# Patient Record
Sex: Female | Born: 1935 | Race: White | Hispanic: No | Marital: Married | Smoking: Former smoker
Health system: Southern US, Community
[De-identification: ages and names within clinical notes are randomized; demographics above are authoritative.]

## PROBLEM LIST (undated history)

## (undated) DIAGNOSIS — I10 Essential (primary) hypertension: Secondary | ICD-10-CM

## (undated) DIAGNOSIS — I2510 Atherosclerotic heart disease of native coronary artery without angina pectoris: Secondary | ICD-10-CM

## (undated) HISTORY — PX: APPENDECTOMY: SHX54

## (undated) HISTORY — PX: ABDOMINAL HYSTERECTOMY: SHX81

## (undated) HISTORY — PX: CHOLECYSTECTOMY: SHX55

---

## 2020-02-04 ENCOUNTER — Other Ambulatory Visit: Payer: Self-pay

## 2020-02-04 ENCOUNTER — Encounter (HOSPITAL_BASED_OUTPATIENT_CLINIC_OR_DEPARTMENT_OTHER): Payer: Medicare Other | Attending: Internal Medicine | Admitting: Internal Medicine

## 2020-02-04 DIAGNOSIS — I87312 Chronic venous hypertension (idiopathic) with ulcer of left lower extremity: Secondary | ICD-10-CM | POA: Diagnosis present

## 2020-02-04 DIAGNOSIS — I129 Hypertensive chronic kidney disease with stage 1 through stage 4 chronic kidney disease, or unspecified chronic kidney disease: Secondary | ICD-10-CM | POA: Insufficient documentation

## 2020-02-04 DIAGNOSIS — Z809 Family history of malignant neoplasm, unspecified: Secondary | ICD-10-CM | POA: Diagnosis not present

## 2020-02-04 DIAGNOSIS — Z888 Allergy status to other drugs, medicaments and biological substances status: Secondary | ICD-10-CM | POA: Diagnosis not present

## 2020-02-04 DIAGNOSIS — Z8249 Family history of ischemic heart disease and other diseases of the circulatory system: Secondary | ICD-10-CM | POA: Diagnosis not present

## 2020-02-04 DIAGNOSIS — Z87891 Personal history of nicotine dependence: Secondary | ICD-10-CM | POA: Insufficient documentation

## 2020-02-04 DIAGNOSIS — N189 Chronic kidney disease, unspecified: Secondary | ICD-10-CM | POA: Insufficient documentation

## 2020-02-04 DIAGNOSIS — Z880 Allergy status to penicillin: Secondary | ICD-10-CM | POA: Insufficient documentation

## 2020-02-04 DIAGNOSIS — N3281 Overactive bladder: Secondary | ICD-10-CM | POA: Insufficient documentation

## 2020-02-04 DIAGNOSIS — L97829 Non-pressure chronic ulcer of other part of left lower leg with unspecified severity: Secondary | ICD-10-CM | POA: Insufficient documentation

## 2020-02-04 DIAGNOSIS — I87332 Chronic venous hypertension (idiopathic) with ulcer and inflammation of left lower extremity: Secondary | ICD-10-CM | POA: Insufficient documentation

## 2020-02-04 DIAGNOSIS — M170 Bilateral primary osteoarthritis of knee: Secondary | ICD-10-CM | POA: Diagnosis not present

## 2020-02-09 NOTE — Progress Notes (Signed)
Melissa Noble, Melissa Noble ([id number]) Visit Report for 02/04/2020 Allergy List Details Patient Name: Date of Service: Melissa Noble, Melissa Noble 02/04/2020 10:30 A M Medical Record Number: [id number] Patient Account Number: [id number] Date of Birth/Sex: Treating RN: 12-08-1923 (86 y.o. Female) Carlene Coria Primary Care Barbarajean Kinzler: Other Clinician: Referring Xitlaly Ault: Treating Rosha Cocker/Extender: Cheree Ditto in Treatment: 0 Allergies Active Allergies ticlopidine Depakote penicillin Allergy Notes Electronic Signature(s) Signed: 02/08/2020 5:49:17 PM By: Carlene Coria RN Entered By: Carlene Coria on 02/04/2020 11:12:40 -------------------------------------------------------------------------------- Arrival Information Details Patient Name: Date of Service: Melissa Noble, Melissa Noble 02/04/2020 10:30 A M Medical Record Number: [id number] Patient Account Number: [id number] Date of Birth/Sex: Treating RN: March 05, 1924 (86 y.o. Female) Carlene Coria Primary Care Helayna Dun: Other Clinician: Referring Shemeka Wardle: Treating Aslin Farinas/Extender: Cheree Ditto in Treatment: 0 Visit Information Patient Arrived: Charlyn Minerva Time: 11:07 Accompanied By: son Transfer Assistance: None Patient Identification Verified: Yes Secondary Verification Process Completed: Yes Patient Requires Transmission-Based Precautions: No Patient Has Alerts: Yes Patient Alerts: Patient on Blood Thinner Electronic Signature(s) Signed: 02/08/2020 5:49:17 PM By: Carlene Coria RN Entered By: Carlene Coria on 02/04/2020 11:08:12 -------------------------------------------------------------------------------- Clinic Level of Care Assessment Details Patient Name: Date of Service: Melissa Noble, Melissa Noble 02/04/2020 10:30 A M Medical Record Number: [id number] Patient Account Number: [id number] Date of Birth/Sex: Treating RN: Oct 09, 1923 (86 y.o. Female) Levan Hurst Primary Care Asuzena Weis: Other Clinician: Referring Leyanna Bittman: Treating  Nivia Gervase/Extender: Cheree Ditto in Treatment: 0 Clinic Level of Care Assessment Items TOOL 2 Quantity Score X- 1 0 Use when only an EandM is performed on the INITIAL visit ASSESSMENTS - Nursing Assessment / Reassessment X- 1 20 General Physical Exam (combine w/ comprehensive assessment (listed just below) when performed on new pt. evals) X- 1 25 Comprehensive Assessment (HX, ROS, Risk Assessments, Wounds Hx, etc.) ASSESSMENTS - Wound and Skin A ssessment / Reassessment []  - 0 Simple Wound Assessment / Reassessment - one wound []  - 0 Complex Wound Assessment / Reassessment - multiple wounds []  - 0 Dermatologic / Skin Assessment (not related to wound area) ASSESSMENTS - Ostomy and/or Continence Assessment and Care []  - 0 Incontinence Assessment and Management []  - 0 Ostomy Care Assessment and Management (repouching, etc.) PROCESS - Coordination of Care X - Simple Patient / Family Education for ongoing care 1 15 []  - 0 Complex (extensive) Patient / Family Education for ongoing care X- 1 10 Staff obtains Programmer, systems, Records, T Results / Process Orders est X- 1 10 Staff telephones HHA, Nursing Homes / Clarify orders / etc []  - 0 Routine Transfer to another Facility (non-emergent condition) []  - 0 Routine Hospital Admission (non-emergent condition) X- 1 15 New Admissions / Biomedical engineer / Ordering NPWT Apligraf, etc. , []  - 0 Emergency Hospital Admission (emergent condition) X- 1 10 Simple Discharge Coordination []  - 0 Complex (extensive) Discharge Coordination PROCESS - Special Needs []  - 0 Pediatric / Minor Patient Management []  - 0 Isolation Patient Management []  - 0 Hearing / Language / Visual special needs []  - 0 Assessment of Community assistance (transportation, D/C planning, etc.) []  - 0 Additional assistance / Altered mentation []  - 0 Support Surface(s) Assessment (bed, cushion, seat, etc.) INTERVENTIONS - Wound Cleansing /  Measurement []  - 0 Wound Imaging (photographs - any number of wounds) []  - 0 Wound Tracing (instead of photographs) []  - 0 Simple Wound Measurement - one wound []  - 0 Complex Wound Measurement - multiple wounds []  - 0 Simple Wound Cleansing - one wound []  - 0 Complex Wound Cleansing - multiple wounds INTERVENTIONS - Wound Dressings []  -  0 Small Wound Dressing one or multiple wounds []  - 0 Medium Wound Dressing one or multiple wounds []  - 0 Large Wound Dressing one or multiple wounds []  - 0 Application of Medications - injection INTERVENTIONS - Miscellaneous []  - 0 External ear exam []  - 0 Specimen Collection (cultures, biopsies, blood, body fluids, etc.) []  - 0 Specimen(s) / Culture(s) sent or taken to Lab for analysis []  - 0 Patient Transfer (multiple staff / Civil Service fast streamer / Similar devices) []  - 0 Simple Staple / Suture removal (25 or less) []  - 0 Complex Staple / Suture removal (26 or more) []  - 0 Hypo / Hyperglycemic Management (close monitor of Blood Glucose) []  - 0 Ankle / Brachial Index (ABI) - do not check if billed separately Has the patient been seen at the hospital within the last three years: Yes Total Score: 105 Level Of Care: New/Established - Level 3 Electronic Signature(s) Signed: 02/04/2020 4:21:43 PM By: Levan Hurst RN, BSN Entered By: Levan Hurst on 02/04/2020 11:49:25 -------------------------------------------------------------------------------- Lower Extremity Assessment Details Patient Name: Date of Service: Melissa Noble, Melissa Noble 02/04/2020 10:30 A M Medical Record Number: [id number] Patient Account Number: [id number] Date of Birth/Sex: Treating RN: 1923/09/23 (86 y.o. Female) Carlene Coria Primary Care Vallie Teters: Other Clinician: Referring Kahmari Herard: Treating Jazlene Bares/Extender: Cheree Ditto in Treatment: 0 Edema Assessment Assessed: [Left: No] [Right: No] [Left: Edema] [Right: :] Calf Left: Right: Point of Measurement: cm From  Medial Instep 26 cm cm Ankle Left: Right: Point of Measurement: cm From Medial Instep 20 cm cm Electronic Signature(s) Signed: 02/08/2020 5:49:17 PM By: Carlene Coria RN Entered By: Carlene Coria on 02/04/2020 11:32:02 -------------------------------------------------------------------------------- Pain Assessment Details Patient Name: Date of Service: Melissa Noble, Melissa Noble 02/04/2020 10:30 A M Medical Record Number: [id number] Patient Account Number: [id number] Date of Birth/Sex: Treating RN: 1924/02/14 (86 y.o. Female) Carlene Coria Primary Care Jalaiyah Throgmorton: Other Clinician: Referring Anaysia Germer: Treating Javoris Star/Extender: Cheree Ditto in Treatment: 0 Active Problems Location of Pain Severity and Description of Pain Patient Has Paino No Site Locations Pain Management and Medication Current Pain Management: Electronic Signature(s) Signed: 02/08/2020 5:49:17 PM By: Carlene Coria RN Entered By: Carlene Coria on 02/04/2020 11:32:24 -------------------------------------------------------------------------------- Patient/Caregiver Education Details Patient Name: Date of Service: Melissa Noble 8/23/2021andnbsp10:30 [place] Record Number: [id number] Patient Account Number: [id number] Date of Birth/Gender: Treating RN: June 05, 1924 (86 y.o. Female) Levan Hurst Primary Care Physician: Other Clinician: Referring Physician: Treating Physician/Extender: Cheree Ditto in Treatment: 0 Education Assessment Education Provided To: Patient Education Topics Provided Venous: Methods: Explain/Verbal Responses: State content correctly Wound/Skin Impairment: Methods: Explain/Verbal Responses: State content correctly Electronic Signature(s) Signed: 02/04/2020 4:21:43 PM By: Levan Hurst RN, BSN Entered By: Levan Hurst on 02/04/2020 11:48:50 -------------------------------------------------------------------------------- Vitals Details Patient Name: Date of  Service: Melissa Noble, Melissa Noble 02/04/2020 10:30 A M Medical Record Number: [id number] Patient Account Number: [id number] Date of Birth/Sex: Treating RN: 07/02/1923 (86 y.o. Female) Epps, [place] Primary Care Korben Carcione: Other Clinician: Referring Melba Araki: Treating Armelia Penton/Extender: Cheree Ditto in Treatment: 0 Vital Signs Time Taken: 11:11 Temperature (F): 98.4 Height (in): 61 Pulse (bpm): 77 Source: Stated Respiratory Rate (breaths/min): 18 Weight (lbs): 98 Blood Pressure (mmHg): 160/60 Source: Stated Reference Range: 80 - 120 mg / dl Body Mass Index (BMI): 18.5 Electronic Signature(s) Signed: 02/08/2020 5:49:17 PM By: Carlene Coria RN Entered By: Carlene Coria on 02/04/2020 11:11:43

## 2020-02-09 NOTE — Progress Notes (Signed)
Melissa Noble, Melissa Noble ([id number]) Visit Report for 02/04/2020 Abuse/Suicide Risk Screen Details Patient Name: Date of Service: Melissa Noble, Melissa Noble 02/04/2020 10:30 A M Medical Record Number: [id number] Patient Account Number: [id number] Date of Birth/Sex: Treating RN: Feb 10, 1924 (86 y.o. Female) Carlene Coria Primary Care Kortney Schoenfelder: Other Clinician: Referring Lacie Landry: Treating Rajah Lamba/Extender: Cheree Ditto in Treatment: 0 Abuse/Suicide Risk Screen Items Answer ABUSE RISK SCREEN: Has anyone close to you tried to hurt or harm you recentlyo No Do you feel uncomfortable with anyone in your familyo No Has anyone forced you do things that you didnt want to doo No Electronic Signature(s) Signed: 02/08/2020 5:49:17 PM By: Carlene Coria RN Entered By: Carlene Coria on 02/04/2020 11:19:24 -------------------------------------------------------------------------------- Activities of Daily Living Details Patient Name: Date of Service: Melissa Noble, Melissa Noble 02/04/2020 10:30 [place] Record Number: [id number] Patient Account Number: [id number] Date of Birth/Sex: Treating RN: May 30, 1924 (86 y.o. Female) Carlene Coria Primary Care Lanetra Hartley: Other Clinician: Referring Benetta Maclaren: Treating Theora Vankirk/Extender: Cheree Ditto in Treatment: 0 Activities of Daily Living Items Answer Activities of Daily Living (Please select one for each item) Drive Automobile Not Able T Medications ake Need Assistance Use T elephone Completely Able Care for Appearance Completely Able Use T oilet Completely Able Bath / Shower Completely Able Dress Self Completely Able Feed Self Completely Able Walk Completely Able Get In / Out Bed Completely Able Housework Not Able Prepare Meals Not Able Handle Money Need Assistance Shop for Self Need Assistance Electronic Signature(s) Signed: 02/08/2020 5:49:17 PM By: Carlene Coria RN Entered By: Carlene Coria on 02/04/2020  11:20:24 -------------------------------------------------------------------------------- Education Screening Details Patient Name: Date of Service: Melissa Noble, Melissa Noble 02/04/2020 10:30 A M Medical Record Number: [id number] Patient Account Number: [id number] Date of Birth/Sex: Treating RN: [birth year] (86 y.o. Female) Carlene Coria Primary Care Marshae Azam: Other Clinician: Referring Janie Strothman: Treating Denys Labree/Extender: Cheree Ditto in Treatment: 0 Primary Learner Assessed: Patient Learning Preferences/Education Level/Primary Language Learning Preference: Explanation Highest Education Level: College or Above Preferred Language: English Cognitive Barrier Language Barrier: No Translator Needed: No Memory Deficit: No Emotional Barrier: No Cultural/Religious Beliefs Affecting Medical Care: No Physical Barrier Impaired Vision: Yes Glasses Impaired Hearing: Yes Hearing Aid Decreased Hand dexterity: No Knowledge/Comprehension Knowledge Level: Medium Comprehension Level: High Ability to understand written instructions: High Ability to understand verbal instructions: High Motivation Anxiety Level: Anxious Cooperation: Cooperative Education Importance: Acknowledges Need Interest in Health Problems: Asks Questions Perception: Coherent Willingness to Engage in Self-Management High Activities: Readiness to Engage in Self-Management High Activities: Electronic Signature(s) Signed: 02/08/2020 5:49:17 PM By: Carlene Coria RN Entered By: Carlene Coria on 02/04/2020 11:21:29 -------------------------------------------------------------------------------- Fall Risk Assessment Details Patient Name: Date of Service: Melissa Noble, Melissa Noble 02/04/2020 10:30 A M Medical Record Number: [id number] Patient Account Number: [id number] Date of Birth/Sex: Treating RN: August 28, 1923 (86 y.o. Female) Epps, [place] Primary Care Nickolaos Brallier: Other Clinician: Referring Gatsby Chismar: Treating  Griffin Dewilde/Extender: Cheree Ditto in Treatment: 0 Fall Risk Assessment Items Have you had 2 or more falls in the last 12 monthso 0 No Have you had any fall that resulted in injury in the last 12 monthso 0 No FALLS RISK SCREEN History of falling - immediate or within 3 months 0 No Secondary diagnosis (Do you have 2 or more medical diagnoseso) 0 No Ambulatory aid None/bed rest/wheelchair/nurse 0 No Crutches/cane/walker 0 No Furniture 0 No Intravenous therapy Access/Saline/Heparin Lock 0 No Gait/Transferring Normal/ bed rest/ wheelchair 0 No Weak (short steps with or without shuffle, stooped but able to lift head while walking, may seek 0 No support from furniture) Impaired (  short steps with shuffle, may have difficulty arising from chair, head down, impaired 0 No balance) Mental Status Oriented to own ability 0 No Electronic Signature(s) Signed: 02/08/2020 5:49:17 PM By: Carlene Coria RN Entered By: Carlene Coria on 02/04/2020 11:21:50 -------------------------------------------------------------------------------- Foot Assessment Details Patient Name: Date of Service: Melissa Noble, Melissa Noble 02/04/2020 10:30 A M Medical Record Number: [id number] Patient Account Number: [id number] Date of Birth/Sex: Treating RN: 05/27/1924 (86 y.o. Female) Carlene Coria Primary Care Kamry Faraci: Other Clinician: Referring Rashawd Laskaris: Treating Demita Tobia/Extender: Cheree Ditto in Treatment: 0 Foot Assessment Items Site Locations + = Sensation present, - = Sensation absent, C = Callus, U = Ulcer R = Redness, W = Warmth, M = Maceration, PU = Pre-ulcerative lesion F = Fissure, S = Swelling, D = Dryness Assessment Right: Left: Other Deformity: No No Prior Foot Ulcer: No No Prior Amputation: No No Charcot Joint: No No Ambulatory Status: Ambulatory Without Help Gait: Steady Electronic Signature(s) Signed: 02/08/2020 5:49:17 PM By: Carlene Coria RN Entered By: Carlene Coria on 02/04/2020  11:31:21 -------------------------------------------------------------------------------- Nutrition Risk Screening Details Patient Name: Date of Service: Melissa Noble, Melissa Noble 02/04/2020 10:30 A M Medical Record Number: [id number] Patient Account Number: [id number] Date of Birth/Sex: Treating RN: 12-Jul-1923 (86 y.o. Female) Epps, [place] Primary Care Devaney Segers: Other Clinician: Referring Kizer Nobbe: Treating Peony Barner/Extender: Cheree Ditto in Treatment: 0 Height (in): 61 Weight (lbs): 98 Body Mass Index (BMI): 18.5 Nutrition Risk Screening Items Score Screening NUTRITION RISK SCREEN: I have an illness or condition that made me change the kind and/or amount of food I eat 0 No I eat fewer than two meals per day 0 No I eat few fruits and vegetables, or milk products 0 No I have three or more drinks of beer, liquor or wine almost every day 0 No I have tooth or mouth problems that make it hard for me to eat 0 No I don't always have enough money to buy the food I need 0 No I eat alone most of the time 0 No I take three or more different prescribed or over-the-counter drugs a day 1 Yes Without wanting to, I have lost or gained 10 pounds in the last six months 0 No I am not always physically able to shop, cook and/or feed myself 2 Yes Nutrition Protocols Good Risk Protocol Moderate Risk Protocol 0 Provide education on nutrition High Risk Proctocol Risk Level: Moderate Risk Score: 3 Electronic Signature(s) Signed: 02/08/2020 5:49:17 PM By: Carlene Coria RN Entered By: Carlene Coria on 02/04/2020 11:22:56

## 2020-02-09 NOTE — Progress Notes (Signed)
Melissa Noble, Melissa Noble ([id number]) Visit Report for 02/04/2020 Chief Complaint Document Details Patient Name: Date of Service: Melissa Noble, Melissa Noble 02/04/2020 10:30 A M Medical Record Number: [id number] Patient Account Number: [id number] Date of Birth/Sex: Treating RN: February 11, 1924 (86 y.o. Female) Levan Hurst Primary Care Provider: Other Clinician: Referring Provider: Treating Provider/Extender: Cheree Ditto in Treatment: 0 Information Obtained from: Patient Chief Complaint 02/04/2020; patient is here for review of the wound on the left lateral lower Electronic Signature(s) Signed: 02/04/2020 4:33:08 PM By: Linton Ham MD Entered By: Linton Ham on 02/04/2020 12:19:52 -------------------------------------------------------------------------------- HPI Details Patient Name: Date of Service: Melissa Noble, Melissa Noble 02/04/2020 10:30 A M Medical Record Number: [id number] Patient Account Number: [id number] Date of Birth/Sex: Treating RN: [birth year] (86 y.o. Female) Levan Hurst Primary Care Provider: Other Clinician: Referring Provider: Treating Provider/Extender: Cheree Ditto in Treatment: 0 History of Present Illness HPI Description: ADMISSION 02/04/2020 This is a 86 year old woman who recently moved to [place] to live in [facility] assisted [facility] on [address]. She had previously been in a [facility] facility in [place] along with her elderly husband. She is here accompanied by her son. They are here for a concern about a wound on the left lateral calf which apparently happened in sometime in March when she hit her lower leg on the dishwasher door. They came in today with some silver alginate over the area. She has support stockings which she wears fairly religiously. I am not certain about whether she has had reflux studies in the past Past medical history; osteoarthritis of the knees, overactive bladder, hypertension, apparently some degree of chronic  renal failure but I am not certain how severe. Electronic Signature(s) Signed: 02/04/2020 4:33:08 PM By: Linton Ham MD Entered By: Linton Ham on 02/04/2020 12:24:21 -------------------------------------------------------------------------------- Physical Exam Details Patient Name: Date of Service: Melissa Noble, Melissa Noble 02/04/2020 10:30 A M Medical Record Number: [id number] Patient Account Number: [id number] Date of Birth/Sex: Treating RN: 11/08/1923 (86 y.o. Female) Levan Hurst Primary Care Provider: Other Clinician: Referring Provider: Treating Provider/Extender: Cheree Ditto in Treatment: 0 Constitutional Patient is hypertensive.. Pulse regular and within target range for patient.Marland Kitchen Respirations regular, non-labored and within target range.. Temperature is normal and within the target range for the patient.Marland Kitchen Appears in no distress. Respiratory work of breathing is normal. Cardiovascular Palpable popliteal and femoral pulses. Pedal pulses easily palpable in the left foot. Patient has minimal edema in the left lower leg. Skin changes of chronic venous disease with hemosiderin deposition.. Notes Wound exam; left lateral lower leg there is no open wound. There was apparently some eschared areas when she came in however with our wound care nurse cleaning up the site there was removal of this and nothing but healed tissue underneath. Electronic Signature(s) Signed: 02/04/2020 4:33:08 PM By: Linton Ham MD Entered By: Linton Ham on 02/04/2020 12:29:36 -------------------------------------------------------------------------------- Physician Orders Details Patient Name: Date of Service: Melissa Noble, Melissa Noble 02/04/2020 10:30 A M Medical Record Number: [id number] Patient Account Number: [id number] Date of Birth/Sex: Treating RN: [birth year] (86 y.o. Female) Levan Hurst Primary Care Provider: Other Clinician: Referring Provider: Treating Provider/Extender:  Cheree Ditto in Treatment: 0 Verbal / Phone Orders: No Diagnosis Coding Discharge From [facility] Services Discharge from [place] only - no open wounds Skin Barriers/Peri-Wound Care Moisturizing lotion - both legs daily Edema Control Avoid standing for long periods of time Elevate legs to the level of the heart or above for 30 minutes daily and/or when sitting, a frequency of: - throughout the day Support Garment 20-30  mm/Hg pressure to: - both legs daily, apply first thing in the morning, remove at night before bed Electronic Signature(s) Signed: 02/04/2020 4:21:43 PM By: Levan Hurst RN, BSN Signed: 02/04/2020 4:33:08 PM By: Linton Ham MD Entered By: Levan Hurst on 02/04/2020 11:48:19 -------------------------------------------------------------------------------- Problem List Details Patient Name: Date of Service: Melissa Noble, Melissa Noble 02/04/2020 10:30 A M Medical Record Number: [id number] Patient Account Number: [id number] Date of Birth/Sex: Treating RN: 23-Jul-1923 (86 y.o. Female) Levan Hurst Primary Care Provider: Other Clinician: Referring Provider: Treating Provider/Extender: Cheree Ditto in Treatment: 0 Active Problems ICD-10 Encounter Code Description Active Date MDM Diagnosis I87.332 Chronic venous hypertension (idiopathic) with ulcer and inflammation of left 02/04/2020 No Yes lower extremity Inactive Problems Resolved Problems Electronic Signature(s) Signed: 02/04/2020 4:33:08 PM By: Linton Ham MD Entered By: Linton Ham on 02/04/2020 12:19:29 -------------------------------------------------------------------------------- Progress Note Details Patient Name: Date of Service: Melissa Noble, Melissa Noble 02/04/2020 10:30 A M Medical Record Number: [id number] Patient Account Number: [id number] Date of Birth/Sex: Treating RN: 1923/06/27 (86 y.o. Female) Levan Hurst Primary Care Provider: Other Clinician: Referring  Provider: Treating Provider/Extender: Cheree Ditto in Treatment: 0 Subjective Chief Complaint Information obtained from Patient 02/04/2020; patient is here for review of the wound on the left lateral lower History of Present Illness (HPI) ADMISSION 02/04/2020 This is a 86 year old woman who recently moved to [place] to live in [facility] assisted [facility] on [address]. She had previously been in a [facility] facility in [place] along with her elderly husband. She is here accompanied by her son. They are here for a concern about a wound on the left lateral calf which apparently happened in sometime in March when she hit her lower leg on the dishwasher door. They came in today with some silver alginate over the area. She has support stockings which she wears fairly religiously. I am not certain about whether she has had reflux studies in the past Past medical history; osteoarthritis of the knees, overactive bladder, hypertension, apparently some degree of chronic renal failure but I am not certain how severe. Patient History Unable to Obtain Patient History due to Altered Mental Status. Information obtained from Patient. Allergies ticlopidine, Depakote, penicillin Family History Cancer - Mother, Heart Disease - Father, Hypertension - Father, No family history of Diabetes, Hereditary Spherocytosis, Kidney Disease, Lung Disease, Seizures, Stroke, Thyroid Problems, Tuberculosis. Social History Former smoker, Marital Status - Married, Alcohol Use - Never, Drug Use - No History, Caffeine Use - Never. Medical History Eyes Denies history of Cataracts, Glaucoma, Optic Neuritis Ear/Nose/Mouth/Throat Denies history of Chronic sinus problems/congestion, Middle ear problems Hematologic/Lymphatic Denies history of Anemia, Hemophilia, Human Immunodeficiency Virus, Lymphedema, Sickle Cell Disease Respiratory Denies history of Aspiration, Asthma, Chronic Obstructive Pulmonary  Disease (COPD), Pneumothorax, Sleep Apnea, Tuberculosis Cardiovascular Patient has history of Hypertension Denies history of Angina, Arrhythmia, Congestive Heart Failure, Coronary Artery Disease, Deep Vein Thrombosis, Hypotension, Myocardial Infarction, Peripheral Arterial Disease, Peripheral Venous Disease, Phlebitis, Vasculitis Gastrointestinal Denies history of Cirrhosis , Colitis, Crohnoos, Hepatitis A, Hepatitis B, Hepatitis C Endocrine Denies history of Type I Diabetes, Type II Diabetes Genitourinary Denies history of End Stage Renal Disease Immunological Denies history of Lupus Erythematosus, Raynaudoos, Scleroderma Integumentary (Skin) Denies history of History of Burn Musculoskeletal Denies history of Gout, Rheumatoid Arthritis, Osteoarthritis, Osteomyelitis Neurologic Denies history of Dementia, Neuropathy, Quadriplegia, Paraplegia, Seizure Disorder Oncologic Denies history of Received Chemotherapy, Received Radiation Psychiatric Denies history of Anorexia/bulimia, Confinement Anxiety Review of Systems (ROS) Constitutional Symptoms (General Health) Denies complaints or symptoms of Fatigue, Fever, Chills, Marked Weight Change.  Eyes Complains or has symptoms of Glasses / Contacts. Denies complaints or symptoms of Dry Eyes, Vision Changes. Ear/Nose/Mouth/Throat Denies complaints or symptoms of Chronic sinus problems or rhinitis. Respiratory Denies complaints or symptoms of Chronic or frequent coughs, Shortness of Breath. Cardiovascular Denies complaints or symptoms of Chest pain. Gastrointestinal Denies complaints or symptoms of Frequent diarrhea, Nausea, Vomiting. Endocrine Denies complaints or symptoms of Heat/cold intolerance. Genitourinary Denies complaints or symptoms of Frequent urination. Integumentary (Skin) Complains or has symptoms of Wounds. Musculoskeletal Denies complaints or symptoms of Muscle Pain, Muscle Weakness. Neurologic Denies complaints or  symptoms of Numbness/parasthesias. Psychiatric Denies complaints or symptoms of Claustrophobia, Suicidal. Objective Constitutional Patient is hypertensive.. Pulse regular and within target range for patient.Marland Kitchen Respirations regular, non-labored and within target range.. Temperature is normal and within the target range for the patient.Marland Kitchen Appears in no distress. Vitals Time Taken: 11:11 AM, Height: 61 in, Source: Stated, Weight: 98 lbs, Source: Stated, BMI: 18.5, Temperature: 98.4 F, Pulse: 77 bpm, Respiratory Rate: 18 breaths/min, Blood Pressure: 160/60 mmHg. Respiratory work of breathing is normal. Cardiovascular Palpable popliteal and femoral pulses. Pedal pulses easily palpable in the left foot. Patient has minimal edema in the left lower leg. Skin changes of chronic venous disease with hemosiderin deposition.. General Notes: Wound exam; left lateral lower leg there is no open wound. There was apparently some eschared areas when she came in however with our wound care nurse cleaning up the site there was removal of this and nothing but healed tissue underneath. 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 The patient does not have an open wound 2. She has support stockings and for now I think it is reasonable to continue with these 3. Also cautioned to lubricate her skin with a moisturizer at night and to keep her legs elevated. 4. There  is no reason for her to follow here at this point Electronic Signature(s) Signed: 02/04/2020 4:33:08 PM By: Linton Ham MD Entered By: Linton Ham on 02/04/2020 12:32:27 -------------------------------------------------------------------------------- HxROS Details Patient Name: Date of Service: Melissa Noble, Melissa Noble 02/04/2020 10:30 A M Medical Record Number: [id number] Patient Account Number: [id number] Date of Birth/Sex: Treating RN: 04-Apr-1924 (86 y.o. Female) Carlene Coria Primary Care Provider: Other Clinician: Referring Provider: Treating Provider/Extender: Cheree Ditto in Treatment: 0 Unable to Obtain Patient History due to Altered Mental Status Information Obtained From Patient Constitutional Symptoms (General Health) Complaints and Symptoms: Negative for: Fatigue; Fever; Chills; Marked Weight Change Eyes Complaints and Symptoms: Positive for: Glasses / Contacts Negative for: Dry Eyes; Vision Changes Medical History: Negative for: Cataracts; Glaucoma; Optic Neuritis Ear/Nose/Mouth/Throat Complaints and Symptoms: Negative for: Chronic sinus problems or rhinitis Medical History: Negative for: Chronic sinus problems/congestion; Middle ear problems Respiratory Complaints and Symptoms: Negative for: Chronic or frequent coughs; Shortness of Breath Medical History: Negative for: Aspiration; Asthma; Chronic Obstructive Pulmonary Disease (COPD); Pneumothorax; Sleep Apnea; Tuberculosis Cardiovascular Complaints and Symptoms: Negative for: Chest pain Medical History: Positive for: Hypertension Negative for: Angina; Arrhythmia; Congestive Heart Failure; Coronary  Artery Disease; Deep Vein Thrombosis; Hypotension; Myocardial Infarction; Peripheral Arterial Disease; Peripheral Venous Disease; Phlebitis; Vasculitis Gastrointestinal Complaints and Symptoms: Negative for: Frequent diarrhea; Nausea; Vomiting Medical History: Negative for: Cirrhosis ; Colitis; Crohns;  Hepatitis A; Hepatitis B; Hepatitis C Endocrine Complaints and Symptoms: Negative for: Heat/cold intolerance Medical History: Negative for: Type I Diabetes; Type II Diabetes Genitourinary Complaints and Symptoms: Negative for: Frequent urination Medical History: Negative for: End Stage Renal Disease Integumentary (Skin) Complaints and Symptoms: Positive for: Wounds Medical History: Negative for: History of Burn Musculoskeletal Complaints and Symptoms: Negative for: Muscle Pain; Muscle Weakness Medical History: Negative for: Gout; Rheumatoid Arthritis; Osteoarthritis; Osteomyelitis Neurologic Complaints and Symptoms: Negative for: Numbness/parasthesias Medical History: Negative for: Dementia; Neuropathy; Quadriplegia; Paraplegia; Seizure Disorder Psychiatric Complaints and Symptoms: Negative for: Claustrophobia; Suicidal Medical History: Negative for: Anorexia/bulimia; Confinement Anxiety Hematologic/Lymphatic Medical History: Negative for: Anemia; Hemophilia; Human Immunodeficiency Virus; Lymphedema; Sickle Cell Disease Immunological Medical History: Negative for: Lupus Erythematosus; Raynauds; Scleroderma Oncologic Medical History: Negative for: Received Chemotherapy; Received Radiation Immunizations Pneumococcal Vaccine: Received Pneumococcal Vaccination: No Implantable Devices None Family and Social History Cancer: Yes - Mother; Diabetes: No; Heart Disease: Yes - Father; Hereditary Spherocytosis: No; Hypertension: Yes - Father; Kidney Disease: No; Lung Disease: No; Seizures: No; Stroke: No; Thyroid Problems: No; Tuberculosis: No; Former smoker; Marital Status - Married; Alcohol Use: Never; Drug Use: No History; Caffeine Use: Never; Financial Concerns: No; Food, Clothing or Shelter Needs: No; Support System Lacking: No; Transportation Concerns: No Electronic Signature(s) Signed: 02/04/2020 4:33:08 PM By: Linton Ham MD Signed: 02/08/2020 5:49:17 PM By: Carlene Coria RN Entered By: Carlene Coria on 02/04/2020 11:19:14 -------------------------------------------------------------------------------- SuperBill Details Patient Name: Date of Service: Melissa Noble, Melissa Noble 02/04/2020 Medical Record Number: [id number] Patient Account Number: [id number] Date of Birth/Sex: Treating RN: 09-23-1923 (86 y.o. Female) Levan Hurst Primary Care Provider: Other Clinician: Referring Provider: Treating Provider/Extender: Cheree Ditto in Treatment: 0 Diagnosis Coding ICD-10 Codes Code Description [phone] Chronic venous hypertension (idiopathic) with ulcer and inflammation of left lower extremity Facility Procedures CPT4 Code: [id number] Description: 33007 - WOUND CARE VISIT-LEV 3 EST PT Modifier: Quantity: 1 Physician Procedures : CPT4 Code Description Modifier [id number] - WC PHYS LEVEL 2 - NEW PT ICD-10 Diagnosis Description I87.332 Chronic venous hypertension (idiopathic) with ulcer and inflammation of left lower extremity Quantity: 1 Electronic Signature(s) Signed: 02/04/2020 4:21:43 PM By: Levan Hurst RN, BSN Signed: 02/04/2020 4:33:08 PM By: Linton Ham MD Entered By: Levan Hurst on 02/04/2020 12:58:30

## 2020-03-21 ENCOUNTER — Other Ambulatory Visit: Payer: Self-pay

## 2020-03-21 ENCOUNTER — Encounter (HOSPITAL_COMMUNITY): Payer: Self-pay | Admitting: Emergency Medicine

## 2020-03-21 ENCOUNTER — Inpatient Hospital Stay (HOSPITAL_COMMUNITY)
Admission: EM | Admit: 2020-03-21 | Discharge: 2020-03-25 | DRG: 522 | Disposition: A | Discharge status: Skilled Nursing Facility | Payer: Medicare Other | Source: Skilled Nursing Facility | Attending: Internal Medicine | Admitting: Internal Medicine

## 2020-03-21 ENCOUNTER — Emergency Department (HOSPITAL_COMMUNITY): Payer: Medicare Other

## 2020-03-21 DIAGNOSIS — E782 Mixed hyperlipidemia: Secondary | ICD-10-CM | POA: Diagnosis present

## 2020-03-21 DIAGNOSIS — W1830XA Fall on same level, unspecified, initial encounter: Secondary | ICD-10-CM | POA: Diagnosis present

## 2020-03-21 DIAGNOSIS — Z90710 Acquired absence of both cervix and uterus: Secondary | ICD-10-CM | POA: Diagnosis not present

## 2020-03-21 DIAGNOSIS — Z880 Allergy status to penicillin: Secondary | ICD-10-CM | POA: Diagnosis not present

## 2020-03-21 DIAGNOSIS — H18519 Endothelial corneal dystrophy, unspecified eye: Secondary | ICD-10-CM | POA: Diagnosis present

## 2020-03-21 DIAGNOSIS — R451 Restlessness and agitation: Secondary | ICD-10-CM | POA: Diagnosis not present

## 2020-03-21 DIAGNOSIS — I129 Hypertensive chronic kidney disease with stage 1 through stage 4 chronic kidney disease, or unspecified chronic kidney disease: Secondary | ICD-10-CM | POA: Diagnosis present

## 2020-03-21 DIAGNOSIS — R410 Disorientation, unspecified: Secondary | ICD-10-CM | POA: Diagnosis not present

## 2020-03-21 DIAGNOSIS — F0390 Unspecified dementia without behavioral disturbance: Secondary | ICD-10-CM | POA: Diagnosis present

## 2020-03-21 DIAGNOSIS — W1839XA Other fall on same level, initial encounter: Secondary | ICD-10-CM | POA: Diagnosis not present

## 2020-03-21 DIAGNOSIS — E785 Hyperlipidemia, unspecified: Secondary | ICD-10-CM | POA: Diagnosis not present

## 2020-03-21 DIAGNOSIS — Z79899 Other long term (current) drug therapy: Secondary | ICD-10-CM | POA: Diagnosis not present

## 2020-03-21 DIAGNOSIS — Z8249 Family history of ischemic heart disease and other diseases of the circulatory system: Secondary | ICD-10-CM | POA: Diagnosis not present

## 2020-03-21 DIAGNOSIS — R011 Cardiac murmur, unspecified: Secondary | ICD-10-CM | POA: Diagnosis not present

## 2020-03-21 DIAGNOSIS — Z96649 Presence of unspecified artificial hip joint: Secondary | ICD-10-CM

## 2020-03-21 DIAGNOSIS — D649 Anemia, unspecified: Secondary | ICD-10-CM | POA: Diagnosis not present

## 2020-03-21 DIAGNOSIS — Z8673 Personal history of transient ischemic attack (TIA), and cerebral infarction without residual deficits: Secondary | ICD-10-CM

## 2020-03-21 DIAGNOSIS — S72009A Fracture of unspecified part of neck of unspecified femur, initial encounter for closed fracture: Secondary | ICD-10-CM | POA: Diagnosis present

## 2020-03-21 DIAGNOSIS — D631 Anemia in chronic kidney disease: Secondary | ICD-10-CM | POA: Diagnosis present

## 2020-03-21 DIAGNOSIS — Z7902 Long term (current) use of antithrombotics/antiplatelets: Secondary | ICD-10-CM | POA: Diagnosis not present

## 2020-03-21 DIAGNOSIS — Z955 Presence of coronary angioplasty implant and graft: Secondary | ICD-10-CM

## 2020-03-21 DIAGNOSIS — S72012A Unspecified intracapsular fracture of left femur, initial encounter for closed fracture: Secondary | ICD-10-CM | POA: Diagnosis present

## 2020-03-21 DIAGNOSIS — N3941 Urge incontinence: Secondary | ICD-10-CM | POA: Diagnosis present

## 2020-03-21 DIAGNOSIS — I350 Nonrheumatic aortic (valve) stenosis: Secondary | ICD-10-CM | POA: Diagnosis present

## 2020-03-21 DIAGNOSIS — G8929 Other chronic pain: Secondary | ICD-10-CM | POA: Diagnosis present

## 2020-03-21 DIAGNOSIS — Z8719 Personal history of other diseases of the digestive system: Secondary | ICD-10-CM

## 2020-03-21 DIAGNOSIS — M25562 Pain in left knee: Secondary | ICD-10-CM | POA: Diagnosis present

## 2020-03-21 DIAGNOSIS — I2510 Atherosclerotic heart disease of native coronary artery without angina pectoris: Secondary | ICD-10-CM | POA: Diagnosis present

## 2020-03-21 DIAGNOSIS — Z20822 Contact with and (suspected) exposure to covid-19: Secondary | ICD-10-CM | POA: Diagnosis present

## 2020-03-21 DIAGNOSIS — Z888 Allergy status to other drugs, medicaments and biological substances status: Secondary | ICD-10-CM | POA: Diagnosis not present

## 2020-03-21 DIAGNOSIS — N184 Chronic kidney disease, stage 4 (severe): Secondary | ICD-10-CM | POA: Diagnosis present

## 2020-03-21 DIAGNOSIS — Z681 Body mass index (BMI) 19 or less, adult: Secondary | ICD-10-CM

## 2020-03-21 DIAGNOSIS — Z800 Family history of malignant neoplasm of digestive organs: Secondary | ICD-10-CM

## 2020-03-21 DIAGNOSIS — Z87891 Personal history of nicotine dependence: Secondary | ICD-10-CM | POA: Diagnosis not present

## 2020-03-21 DIAGNOSIS — S72002A Fracture of unspecified part of neck of left femur, initial encounter for closed fracture: Secondary | ICD-10-CM | POA: Diagnosis not present

## 2020-03-21 DIAGNOSIS — R404 Transient alteration of awareness: Secondary | ICD-10-CM | POA: Diagnosis not present

## 2020-03-21 DIAGNOSIS — R4701 Aphasia: Secondary | ICD-10-CM | POA: Diagnosis present

## 2020-03-21 DIAGNOSIS — R4182 Altered mental status, unspecified: Secondary | ICD-10-CM | POA: Diagnosis not present

## 2020-03-21 DIAGNOSIS — I6521 Occlusion and stenosis of right carotid artery: Secondary | ICD-10-CM | POA: Diagnosis not present

## 2020-03-21 HISTORY — DX: Atherosclerotic heart disease of native coronary artery without angina pectoris: I25.10

## 2020-03-21 HISTORY — DX: Essential (primary) hypertension: I10

## 2020-03-21 LAB — COMPREHENSIVE METABOLIC PANEL
ALT: 22 U/L (ref 0–44)
AST: 23 U/L (ref 15–41)
Albumin: 3.4 g/dL — ABNORMAL LOW (ref 3.5–5.0)
Alkaline Phosphatase: 61 U/L (ref 38–126)
Anion gap: 10 (ref 5–15)
BUN: 53 mg/dL — ABNORMAL HIGH (ref 8–23)
CO2: 19 mmol/L — ABNORMAL LOW (ref 22–32)
Calcium: 9.8 mg/dL (ref 8.9–10.3)
Chloride: 109 mmol/L (ref 98–111)
Creatinine, Ser: 1.98 mg/dL — ABNORMAL HIGH (ref 0.44–1.00)
GFR calc non Af Amer: 21 mL/min — ABNORMAL LOW (ref >60)
Glucose, Bld: 125 mg/dL — ABNORMAL HIGH (ref 70–99)
Potassium: 4.3 mmol/L (ref 3.5–5.1)
Sodium: 138 mmol/L (ref 135–145)
Total Bilirubin: 0.8 mg/dL (ref 0.3–1.2)
Total Protein: 6.1 g/dL — ABNORMAL LOW (ref 6.5–8.1)

## 2020-03-21 LAB — CBC WITH DIFFERENTIAL/PLATELET
Abs Immature Granulocytes: 0.08 10*3/uL — ABNORMAL HIGH (ref 0.00–0.07)
Basophils Absolute: 0 10*3/uL (ref 0.0–0.1)
Basophils Relative: 0 %
Eosinophils Absolute: 0 10*3/uL (ref 0.0–0.5)
Eosinophils Relative: 0 %
HCT: 30.9 % — ABNORMAL LOW (ref 36.0–46.0)
Hemoglobin: 10.1 g/dL — ABNORMAL LOW (ref 12.0–15.0)
Immature Granulocytes: 1 %
Lymphocytes Relative: 7 %
Lymphs Abs: 0.9 10*3/uL (ref 0.7–4.0)
MCH: 31.4 pg (ref 26.0–34.0)
MCHC: 32.7 g/dL (ref 30.0–36.0)
MCV: 96 fL (ref 80.0–100.0)
Monocytes Absolute: 1 10*3/uL (ref 0.1–1.0)
Monocytes Relative: 7 %
Neutro Abs: 11.9 10*3/uL — ABNORMAL HIGH (ref 1.7–7.7)
Neutrophils Relative %: 85 %
Platelets: 209 10*3/uL (ref 150–400)
RBC: 3.22 MIL/uL — ABNORMAL LOW (ref 3.87–5.11)
RDW: 13.1 % (ref 11.5–15.5)
WBC: 14 10*3/uL — ABNORMAL HIGH (ref 4.0–10.5)
nRBC: 0 % (ref 0.0–0.2)

## 2020-03-21 LAB — PROTIME-INR
INR: 1.1 (ref 0.8–1.2)
Prothrombin Time: 14.2 seconds (ref 11.4–15.2)

## 2020-03-21 LAB — RESPIRATORY PANEL BY RT PCR (FLU A&B, COVID)
Influenza A by PCR: NEGATIVE
Influenza B by PCR: NEGATIVE
SARS Coronavirus 2 by RT PCR: NEGATIVE

## 2020-03-21 MED ORDER — HYDROMORPHONE HCL 1 MG/ML IJ SOLN
0.5000 mg | INTRAMUSCULAR | Status: DC | PRN
  Administered 2020-03-21 – 2020-03-22 (×3): 0.5 mg via INTRAVENOUS
  Filled 2020-03-21 (×4): qty 1

## 2020-03-21 MED ORDER — ACETAMINOPHEN 325 MG PO TABS
650.0000 mg | ORAL_TABLET | Freq: Four times a day (QID) | ORAL | Status: DC | PRN
  Filled 2020-03-21: qty 2

## 2020-03-21 MED ORDER — SIMVASTATIN 20 MG PO TABS
20.0000 mg | ORAL_TABLET | Freq: Every day | ORAL | Status: DC
  Administered 2020-03-21 – 2020-03-24 (×4): 20 mg via ORAL
  Filled 2020-03-21 (×4): qty 1

## 2020-03-21 MED ORDER — POLYETHYLENE GLYCOL 3350 17 G PO PACK: 17.0000 g | PACK | Freq: Every day | ORAL | Status: DC | PRN

## 2020-03-21 MED ORDER — ACETAMINOPHEN 650 MG RE SUPP: 650.0000 mg | Freq: Four times a day (QID) | RECTAL | Status: DC | PRN

## 2020-03-21 MED ORDER — ENSURE PRE-SURGERY PO LIQD
296.0000 mL | Freq: Once | ORAL | Status: DC
  Filled 2020-03-21: qty 296

## 2020-03-21 MED ORDER — LOSARTAN POTASSIUM 50 MG PO TABS: 100.0000 mg | ORAL_TABLET | Freq: Every day | ORAL | Status: DC

## 2020-03-21 MED ORDER — ONDANSETRON HCL 4 MG/2ML IJ SOLN
4.0000 mg | Freq: Four times a day (QID) | INTRAMUSCULAR | Status: DC | PRN
  Administered 2020-03-21: 4 mg via INTRAVENOUS
  Filled 2020-03-21: qty 2

## 2020-03-21 MED ORDER — HYDROCHLOROTHIAZIDE 25 MG PO TABS: 12.5000 mg | ORAL_TABLET | Freq: Every day | ORAL | Status: DC

## 2020-03-21 MED ORDER — ONDANSETRON HCL 4 MG/2ML IJ SOLN
4.0000 mg | Freq: Once | INTRAMUSCULAR | Status: AC
  Administered 2020-03-21: 4 mg via INTRAVENOUS
  Filled 2020-03-21: qty 2

## 2020-03-21 MED ORDER — LOSARTAN POTASSIUM 50 MG PO TABS
100.0000 mg | ORAL_TABLET | Freq: Every day | ORAL | Status: DC
  Administered 2020-03-21 – 2020-03-25 (×4): 100 mg via ORAL
  Filled 2020-03-21 (×4): qty 2

## 2020-03-21 NOTE — H&P (Addendum)
Date: 03/21/2020               Patient Name:  Melissa Noble MRN: [id number]  DOB: [birth year] Age / Sex: 86 y.o., female   PCP: Roetta Sessions, NP         Medical Service: Internal Medicine Teaching Service         Attending Physician: Dr. Lucious Groves, DO    First Contact: Dr. Johnney Ou Pager: [phone]  Second Contact: Dr. Gilford Rile Pager: [phone]       After Hours (After 5p/  First Contact Pager: [phone]  weekends / holidays): Second Contact Pager: [phone]   Chief Complaint: Fall, Left Hip Pain  History of Present Illness:   Melissa Noble is a 86 y/o F with a relevant past medical history of coronary artery disease, transient ischemic attack, right carotid artery stenosis, Stage 4 Kidney Disease, hyperlipidemia, and hypertension who presented to the ED from her retirement community after falling and having left hip pain. Yesterday the patient states she was getting into bed when noticed her husband start to fall, she attempted to catch him and fell herself. She states she landed on her left hip and hit her head. She denies any loss of consciousness, the fall was witnessed by her husband. The patient reports her husband falls often. The patient went back to bed and called EMS in the morning when she continued to have left sided hip pain. She also notes head as well as neck pain.    She denies any chest pain, dizziness, lightheadedness, shortness of breath, abdominal pain, n/v/d, difficulty urinating or passing her bowels.   She has no other complaints at the time of my examination.   Meds:  Current Meds  Medication Sig  . clopidogrel (PLAVIX) 75 MG tablet Take 75 mg by mouth daily.  . cycloSPORINE (RESTASIS) 0.05 % ophthalmic emulsion Place 1 drop into both eyes 2 (two) times daily. 8am and 8pm  . hydrochlorothiazide (HYDRODIURIL) 12.5 MG tablet Take 12.5 mg by mouth daily.  Marland Kitchen levocetirizine (XYZAL) 5 MG tablet Take 5 mg by mouth daily.  Marland Kitchen losartan (COZAAR)  100 MG tablet Take 100 mg by mouth daily.  . Multiple Vitamins-Minerals (CENTRUM SILVER) tablet Take 1 tablet by mouth daily.  . nitroGLYCERIN (NITROSTAT) 0.4 MG SL tablet Place 0.4 mg under the tongue every 5 (five) minutes as needed for chest pain.   Marland Kitchen olopatadine (PATANOL) 0.1 % ophthalmic solution Place 1 drop into both eyes daily.  Marland Kitchen oxybutynin (DITROPAN-XL) 10 MG 24 hr tablet Take 10 mg by mouth daily.  . Polyethylene Glycol 400 0.25 % SOLN Place 1 drop into both eyes at bedtime.  Marland Kitchen PREDNISOLONE ACETATE P-F 1 % ophthalmic suspension Place 1 drop into both eyes 3 (three) times daily. 8am, 2pm, and 8pm  . simvastatin (ZOCOR) 20 MG tablet Take 20 mg by mouth at bedtime.   Allergies: Allergies as of 03/21/2020 - Review Complete 03/21/2020  Allergen Reaction Noted  . Ticlopidine Hives, Itching, Swelling, and Rash 07/12/2008  . Depakote [divalproex sodium] Other (See Comments) 03/21/2020  . Penicillins Rash 03/21/2020   Past Medical History . Wound of left leg  . Left leg injury, subsequent encounter  . Allergic conjunctivitis, acute, bilateral  . TIA (transient ischemic attack) . Expressive aphasia  . Keratoconjunctivitis sicca . Swelling of hand joint, right  . Cervical paraspinal muscle spasm   . Early satiety  . Abnormal weight loss  . Anorexia  . Fuchs' corneal dystrophy  .  Hyperopia with astigmatism and presbyopia, bilateral   . Bilateral posterior capsular opacification  . Chronic pain of left knee  . DOE (dyspnea on exertion)  . Nonrheumatic aortic valve stenosis  . Stage 4 chronic kidney disease (CMS/HCC) . Obstruction of right carotid artery without cerebral infarction  . Postmenopausal status . Hypomagnesemia  . Osteoarthritis of knee   . Hypercalcemia  . Dysplasia of cervix uteri  . Acquired absence of both cervix and uterus  . Personal history of other diseases of digestive system   . Osteoarthritis of hand  . Acquired trigger finger  . Incomplete emptying of  bladder   . Vitamin D deficiency  . Anemia . Atopic rhinitis  . History of colonic polyps . Atherosclerosis of native coronary artery of native heart without angina pectoris . Family history of malignant neoplasm of digestive organ   . Overactive bladder  . Mixed hyperlipidemia  . Benign essential hypertension  . Carotid stenosis, asymptomatic, bilateral   Past Surgical History:   Appendectomy  Cardiac surgery   Cataract Extraction  Colonscopy  Hysterectomy  Tonsillectomy   Family History Coronary artery disease Father  . Heart attack Father  . Heart failure Father  . Other Father  emphysema  . Other Mother  colon cancer  . Colon cancer Mother  . Hypertension Mother  . Blindness Son  . Glaucoma Neg Hx   Social History: Tobacco: Former Research scientist (life sciences), Quit 1965 Alcohol: None Illicit Substance Abuse: None  Review of Systems: A complete ROS was negative except as per HPI.   Physical Exam: Blood pressure (!) 156/72, pulse 80, temperature 98.5 F (36.9 C), temperature source Oral, resp. rate 18, height 5\' 1"  (1.549 m), weight 32.7 kg, SpO2 98 %. Physical Exam Vitals and nursing note reviewed.  Constitutional:      Appearance: Normal appearance.  HENT:     Head: Normocephalic and atraumatic.  Cardiovascular:     Rate and Rhythm: Normal rate and regular rhythm.     Heart sounds: Murmur (2/6 systolic) heard.  No friction rub. No gallop.   Pulmonary:     Effort: Pulmonary effort is normal. No respiratory distress.     Breath sounds: Normal breath sounds.  Abdominal:     General: Abdomen is flat.     Palpations: Abdomen is soft.     Tenderness: There is no abdominal tenderness.  Musculoskeletal:     Cervical back: Normal range of motion and neck supple. No tenderness.     Right lower leg: No edema.     Left lower leg: No edema.     Comments: No spinal tenderness. Tender left hip.   Skin:    General: Skin is warm and dry.  Neurological:     General: No focal deficit  present.     Mental Status: She is alert and oriented to person, place, and time. Mental status is at baseline.  Psychiatric:        Mood and Affect: Mood normal.        Behavior: Behavior normal.    EKG: Pending  CBC Latest Ref Rng & Units 03/21/2020  WBC 4.0 - 10.5 K/uL 14.0(H)  Hemoglobin 12.0 - 15.0 g/dL 10.1(L)  Hematocrit 36 - 46 % 30.9(L)  Platelets 150 - 400 K/uL 209  INR     1.1 Prothrombin Time   14.2  CMP Latest Ref Rng & Units 03/21/2020  Glucose 70 - 99 mg/dL 125(H)  BUN 8 - 23 mg/dL 53(H)  Creatinine 0.44 - 1.00  mg/dL 1.98(H)  Sodium 135 - 145 mmol/L 138  Potassium 3.5 - 5.1 mmol/L 4.3  Chloride 98 - 111 mmol/L 109  CO2 22 - 32 mmol/L 19(L)  Calcium 8.9 - 10.3 mg/dL 9.8  Total Protein 6.5 - 8.1 g/dL 6.1(L)  Total Bilirubin 0.3 - 1.2 mg/dL 0.8  Alkaline Phos 38 - 126 U/L 61  AST 15 - 41 U/L 23  ALT 0 - 44 U/L 22   Imaging Chest X-Ray IMPRESSION: Lungs clear. Cardiac silhouette normal. No pneumothorax. Aortic atherosclerosis as well as carotid artery calcification bilaterally. Bones osteoporotic.  Left Hip X-ray IMPRESSION: Left subcapital femoral neck fracture with mild varus angulation and impaction at the fracture site. No other fracture. No dislocation. Symmetric narrowing of each hip joint. Bones diffusely osteoporotic. Multiple foci of arterial vascular calcification noted.  CT Head/Cervical Spine w/o contrast CT head:  1. No CT evidence of acute intracranial process. 2. Right temporal lobe encephalomalacia compatible with remote ischemia. 3. Chronic microvascular ischemic change and cerebral volume loss.  CT cervical spine:  1. No evidence of acute fracture or traumatic listhesis of the cervical spine. 2. Subtle superior endplate depression of the T2 vertebral body where there is a small endplate Schmorl's node. Findings are favored chronic/degenerative. Correlate for point tenderness at this level. 3. Advanced degenerative disc disease  of C5-6 and advanced multilevel facet arthropathy. 4. Heterogeneous thyroid containing numerous small nodules. In the setting of significant comorbidities or limited life expectancy, no follow-up recommended  Echo from 12/2018  Normal LV systolic function, EF 60 - 65%, with mild diastolic dysfunction.  The aortic valve is trileaflet, moderately calcified with reduced leaflet mobility. There is focal calcification of the non coronary cusp. Mild-to-moderate aortic stenosis with mild insufficiency.  There is moderate posterior mitral annular calcification.  Mild tricuspid regurgitation with borderline estimated pulmonary pressures.  Assessment & Plan by Problem: Active Problems:   Hip fracture [facility])  Melissa Noble is a 86 y/o F with a PMHx of coronary artery disease, transient ischemic attack, right carotid artery stenosis, Stage 4 Kidney Disease, hyperlipidemia, and hypertension with a mechanical fall and left hip pain. Upon presentation to the ED for consistent left hip pain, she was found to have a left subcapital femoral neck fracture. Patient evaluated by orthopedics and they plan to take the patient to the OR tomorrow for repair.   Left Subcapital Femoral Neck Fracture Secondary to Mechanical Fall Patient with mechanical fall after attempting to prevent her husband from falling and injuring her left hip. Patient denies loss of consciousness. X-ray imaging revealed left subcapital femoral neck fracture. Low suspicion fall was due to syncopal episode, neurological or cardiac event. Patient evaluated by orthopedics who will take patient to OR tomorrow. Patient to be cleared by medical team per orthopedics. Patient with 2 points on revised cardiac risk index, class III risk. 10.1% 30 day risk of death, MI, or cardiac arrest. Patient on statin therapy as well as plavix. Ordering EKG. Suspect patient's leukocytosis of 14.0 due to fracture, will continue to trend.    - Surgery tomorrow  via orthopedics - NPO at midnight - Hold anticoagulation, SCD's ordered - Pain control with dilaudid injection .5mg  IV q4h PRN for moderate pain, acetaminophen tab 650 mg or suppository 650 mg q6h PRN for mild pain/fever.   History of Chronic Kidney Disease Stage 4 Per care everywhere review, patient with history of CKD stage 4, baseline Cr of 1.65-1.96 over the last year. Etiology of ischemic nephropathy and HTN.  Upon admission creatinine of 1.98.   Normocytic Anemia Patient with hemoglobin of 10.1 and MCV of 96. Do not suspect active bleeding. Patient with past medical history of anemia due to chronic kidney disease. Will continue to monitor and transfuse as needed.   - Daily CBC's - Consider transfusion if hemoglobin below 7  Hypertension Blood pressure of 156/72 upon hospital admission. Med rec states patient is taking HCTZ and losartan, however, upon chart review, last PCP note from 11/2019 states discontinue HCTZ. Will continue losartan only in light of PCP note and patient's creatinine of 1.98  - Continue to monitor blood pressure - Continue home losartan 100 mg daily  Hyperlipidemia Per chart review LDL level of 138 on 09/2019. Will continue home medication of simvastatin 20 mg daily  v - Continue simvastatin 20 mg daily  Urge Incontinence Patient on oxybutynin 10 mg 24 hr tab, will continue post-operatively  History of CAD Patient with CAD s/p stent in 1995. Will continue nitroglycerin PRN post operatively.   History of TIA Patient with history of TIA, on 75 mg plavix at home. Will discontinue and resume after surgery.  Systolic Murmur 2/6 systolic murmur on examination, consistent with aortic stenosis on echo from 2020. Will continue to monitor.  IV Fluids: None Diet: Heart, NPO at midnight DVT Prophylaxis: SCDs Code Status: Full Code  Dispo: Admit patient to Inpatient with expected length of stay greater than 2 midnights.  SignedRiesa Pope,  MD 03/21/2020, 6:36 PM  Pager: [phone] After 5pm on weekdays and 1pm on weekends: On Call pager: [phone]

## 2020-03-21 NOTE — Anesthesia Preprocedure Evaluation (Addendum)
Anesthesia Evaluation  Patient identified by MRN, date of birth, ID band Patient confused    Reviewed: Allergy & Precautions, NPO status , Patient's Chart, lab work & pertinent test results  History of Anesthesia Complications Negative for: history of anesthetic complications  Airway   TM Distance: >3 FB    Comment:  Unable to complete exam due to mental status  Dental  (+) Dental Advisory Given   Pulmonary former smoker,    breath sounds clear to auscultation       Cardiovascular hypertension, Pt. on medications + CAD  + Valvular Problems/Murmurs AS  Rhythm:Regular Rate:Normal   '20 TTE (Care Everywhere) - EF 60 - 65%, with mild diastolic dysfunction. Mild-to-moderate AS with mild AI. Mild TR with borderline estimated pulmonary pressures.      Neuro/Psych PSYCHIATRIC DISORDERS Dementia negative neurological ROS     GI/Hepatic negative GI ROS, Neg liver ROS,   Endo/Other  negative endocrine ROS  Renal/GU negative Renal ROS     Musculoskeletal negative musculoskeletal ROS (+)   Abdominal   Peds  Hematology  (+) anemia ,  On plavix, last dose 10/7    Anesthesia Other Findings Underweight Covid test negative   Reproductive/Obstetrics                           Anesthesia Physical Anesthesia Plan  ASA: III  Anesthesia Plan: General   Post-op Pain Management:    Induction: Intravenous  PONV Risk Score and Plan: 3 and Treatment may vary due to age or medical condition, Ondansetron and Propofol infusion  Airway Management Planned: Oral ETT  Additional Equipment: None  Intra-op Plan:   Post-operative Plan: Extubation in OR  Informed Consent: I have reviewed the patients History and Physical, chart, labs and discussed the procedure including the risks, benefits and alternatives for the proposed anesthesia with the patient or authorized representative who has indicated his/her  understanding and acceptance.     Dental advisory given, History available from chart only and Consent reviewed with POA  Plan Discussed with: CRNA and Anesthesiologist  Anesthesia Plan Comments: (H&P reviewed with and consent obtained from son, Jenny Reichmann, listed in [place] via telephone)      Anesthesia Quick Evaluation

## 2020-03-21 NOTE — Consult Note (Signed)
Reason for Consult:Left hip fx Referring Physician: Rosine Abe  Melissa Noble is an 86 y.o. female.  HPI: [place] [facility] [place]. While she was getting into bed she had pain in her left hip and then fell. She was helped into bed but was unable to get up again when she had to urinate and was brought to the ED this morning for evaluation. X-rays showed a left femoral neck fx and orthopedic surgery was consulted.  Past Medical History:  Diagnosis Date  . Coronary artery disease   . Hypertension     No family history on file.  Social History:  reports that she has quit smoking. She has never used smokeless tobacco. She reports previous alcohol use. She reports previous drug use.  Allergies:  Allergies  Allergen Reactions  . Ticlopidine Hives, Itching, Swelling and Rash  . Depakote [Divalproex Sodium] Other (See Comments)    Unknown   . Penicillins Rash    Medications: I have reviewed the patient's current medications.  Results for orders placed or performed during the hospital encounter of 03/21/20 (from the past 48 hour(s))  Comprehensive metabolic panel     Status: Abnormal   Collection Time: 03/21/20  8:20 AM  Result Value Ref Range   Sodium 138 135 - 145 mmol/L   Potassium 4.3 3.5 - 5.1 mmol/L   Chloride 109 98 - 111 mmol/L   CO2 19 (L) 22 - 32 mmol/L   Glucose, Bld 125 (H) 70 - 99 mg/dL    Comment: Glucose reference range applies only to samples taken after fasting for at least 8 hours.   BUN 53 (H) 8 - 23 mg/dL   Creatinine, Ser 1.98 (H) 0.44 - 1.00 mg/dL   Calcium 9.8 8.9 - 10.3 mg/dL   Total Protein 6.1 (L) 6.5 - 8.1 g/dL   Albumin 3.4 (L) 3.5 - 5.0 g/dL   AST 23 15 - 41 U/L   ALT 22 0 - 44 U/L   Alkaline Phosphatase 61 38 - 126 U/L   Total Bilirubin 0.8 0.3 - 1.2 mg/dL   GFR calc non Af Amer 21 (L) >60 mL/min   Anion gap 10 5 - 15    Comment: Performed at [facility] [address], [place],  [place] [address]  CBC with Differential     Status: Abnormal   Collection Time: 03/21/20  8:20 AM  Result Value Ref Range   WBC 14.0 (H) 4.0 - 10.5 K/uL   RBC 3.22 (L) 3.87 - 5.11 MIL/uL   Hemoglobin 10.1 (L) 12.0 - 15.0 g/dL   HCT 30.9 (L) 36 - 46 %   MCV 96.0 80.0 - 100.0 fL   MCH 31.4 26.0 - 34.0 pg   MCHC 32.7 30.0 - 36.0 g/dL   RDW 13.1 11.5 - 15.5 %   Platelets 209 150 - 400 K/uL   nRBC 0.0 0.0 - 0.2 %   Neutrophils Relative % 85 %   Neutro Abs 11.9 (H) 1.7 - 7.7 K/uL   Lymphocytes Relative 7 %   Lymphs Abs 0.9 0.7 - 4.0 K/uL   Monocytes Relative 7 %   Monocytes Absolute 1.0 0.1 - 1.0 K/uL   Eosinophils Relative 0 %   Eosinophils Absolute 0.0 0 - 0 K/uL   Basophils Relative 0 %   Basophils Absolute 0.0 0 - 0 K/uL   Immature Granulocytes 1 %   Abs Immature Granulocytes 0.08 (H) 0.00 - 0.07  K/uL    Comment: Performed at [facility], [place] [address] [place], [place] [address]  Protime-INR     Status: None   Collection Time: 03/21/20  8:20 AM  Result Value Ref Range   Prothrombin Time 14.2 11.4 - 15.2 seconds   INR 1.1 0.8 - 1.2    Comment: (NOTE) INR goal varies based on device and disease states. Performed at [facility], [place] [address] [place], [place] [address]     DG Chest 1 View  Result Date: 03/21/2020 CLINICAL DATA:  Pain following fall EXAM: CHEST  1 VIEW COMPARISON:  None. FINDINGS: Lungs are clear. Heart size and pulmonary vascularity are normal. No adenopathy. There is aortic atherosclerosis. There is calcification in each carotid artery with a stent in the left carotid artery region. There is no evident pneumothorax. No fracture. Bones are osteoporotic. IMPRESSION: Lungs clear. Cardiac silhouette normal. No pneumothorax. Aortic atherosclerosis as well as carotid artery calcification bilaterally. Bones osteoporotic. Aortic Atherosclerosis (ICD10-I70.0). Electronically Signed   By: Lowella Grip III M.D.   On: 03/21/2020 08:58   CT Head Wo  Contrast  Result Date: 03/21/2020 CLINICAL DATA:  Fall on blood thinners.  Head trauma.  Neck pain. EXAM: CT HEAD WITHOUT CONTRAST CT CERVICAL SPINE WITHOUT CONTRAST TECHNIQUE: Multidetector CT imaging of the head and cervical spine was performed following the standard protocol without intravenous contrast. Multiplanar CT image reconstructions of the cervical spine were also generated. COMPARISON:  None. FINDINGS: CT HEAD FINDINGS Brain: No evidence of acute infarction, hemorrhage, hydrocephalus, extra-axial collection or mass lesion/mass effect. Right temporal lobe encephalomalacia compatible with remote ischemia. Extensive low-density changes within the periventricular and subcortical white matter compatible with chronic microvascular ischemic change. Mild diffuse cerebral volume loss. Vascular: Atherosclerotic calcifications involving the large vessels of the skull base. No unexpected hyperdense vessel. Skull: Normal. Negative for fracture or focal lesion. Sinuses/Orbits: No acute finding. Other: None. CT CERVICAL SPINE FINDINGS Alignment: Facet joints are aligned without dislocation or traumatic listhesis. Dens and lateral masses are aligned. Trace retrolisthesis C5 on C6. Skull base and vertebrae: Subtle superior endplate depression of the T2 vertebral body where there is a small superior endplate Schmorl's node. Findings are favored chronic. Elsewhere. No evidence of acute fracture. No suspicious bone lesion. Soft tissues and spinal canal: No prevertebral fluid or swelling. No visible canal hematoma. Disc levels: Advanced degenerative disc disease of C5-6 with prominent endplate spurring and uncovertebral arthropathy. Remaining disc heights are relatively preserved. There is multilevel bilateral advanced facet arthropathy throughout the cervical spine. Upper chest: Mild biapical pleuroparenchymal scarring within the visualized lung apices. Other: Heterogeneous thyroid containing numerous small nodules.  Left-sided carotid stent. IMPRESSION: CT head: 1. No CT evidence of acute intracranial process. 2. Right temporal lobe encephalomalacia compatible with remote ischemia. 3. Chronic microvascular ischemic change and cerebral volume loss. CT cervical spine: 1. No evidence of acute fracture or traumatic listhesis of the cervical spine. 2. Subtle superior endplate depression of the T2 vertebral body where there is a small endplate Schmorl's node. Findings are favored chronic/degenerative. Correlate for point tenderness at this level. 3. Advanced degenerative disc disease of C5-6 and advanced multilevel facet arthropathy. 4. Heterogeneous thyroid containing numerous small nodules. In the setting of significant comorbidities or limited life expectancy, no follow-up recommended (ref: J Am Coll Radiol. 2015 Feb;12(2): 143-50). Electronically Signed   By: Davina Poke D.O.   On: 03/21/2020 09:26   CT Cervical Spine Wo Contrast  Result Date: 03/21/2020 CLINICAL  DATA:  Fall on blood thinners.  Head trauma.  Neck pain. EXAM: CT HEAD WITHOUT CONTRAST CT CERVICAL SPINE WITHOUT CONTRAST TECHNIQUE: Multidetector CT imaging of the head and cervical spine was performed following the standard protocol without intravenous contrast. Multiplanar CT image reconstructions of the cervical spine were also generated. COMPARISON:  None. FINDINGS: CT HEAD FINDINGS Brain: No evidence of acute infarction, hemorrhage, hydrocephalus, extra-axial collection or mass lesion/mass effect. Right temporal lobe encephalomalacia compatible with remote ischemia. Extensive low-density changes within the periventricular and subcortical white matter compatible with chronic microvascular ischemic change. Mild diffuse cerebral volume loss. Vascular: Atherosclerotic calcifications involving the large vessels of the skull base. No unexpected hyperdense vessel. Skull: Normal. Negative for fracture or focal lesion. Sinuses/Orbits: No acute finding. Other:  None. CT CERVICAL SPINE FINDINGS Alignment: Facet joints are aligned without dislocation or traumatic listhesis. Dens and lateral masses are aligned. Trace retrolisthesis C5 on C6. Skull base and vertebrae: Subtle superior endplate depression of the T2 vertebral body where there is a small superior endplate Schmorl's node. Findings are favored chronic. Elsewhere. No evidence of acute fracture. No suspicious bone lesion. Soft tissues and spinal canal: No prevertebral fluid or swelling. No visible canal hematoma. Disc levels: Advanced degenerative disc disease of C5-6 with prominent endplate spurring and uncovertebral arthropathy. Remaining disc heights are relatively preserved. There is multilevel bilateral advanced facet arthropathy throughout the cervical spine. Upper chest: Mild biapical pleuroparenchymal scarring within the visualized lung apices. Other: Heterogeneous thyroid containing numerous small nodules. Left-sided carotid stent. IMPRESSION: CT head: 1. No CT evidence of acute intracranial process. 2. Right temporal lobe encephalomalacia compatible with remote ischemia. 3. Chronic microvascular ischemic change and cerebral volume loss. CT cervical spine: 1. No evidence of acute fracture or traumatic listhesis of the cervical spine. 2. Subtle superior endplate depression of the T2 vertebral body where there is a small endplate Schmorl's node. Findings are favored chronic/degenerative. Correlate for point tenderness at this level. 3. Advanced degenerative disc disease of C5-6 and advanced multilevel facet arthropathy. 4. Heterogeneous thyroid containing numerous small nodules. In the setting of significant comorbidities or limited life expectancy, no follow-up recommended (ref: J Am Coll Radiol. 2015 Feb;12(2): 143-50). Electronically Signed   By: Davina Poke D.O.   On: 03/21/2020 09:26   DG Hip Unilat With Pelvis 2-3 Views Left  Result Date: 03/21/2020 CLINICAL DATA:  Pain following fall EXAM: DG  HIP (WITH OR WITHOUT PELVIS) 2-3V LEFT COMPARISON:  None. FINDINGS: Frontal pelvis as well as frontal and lateral left hip images were obtained. There is a subcapital femoral neck fracture on the left with impaction and mild varus angulation at the fracture site. No other fracture. No dislocation. Bones are osteoporotic. There is mild symmetric narrowing of each hip joint. No erosion. There are multiple foci of arterial vascular calcification IMPRESSION: Left subcapital femoral neck fracture with mild varus angulation and impaction at the fracture site. No other fracture. No dislocation. Symmetric narrowing of each hip joint. Bones diffusely osteoporotic. Multiple foci of arterial vascular calcification noted. Electronically Signed   By: Lowella Grip III M.D.   On: 03/21/2020 08:57    Review of Systems  HENT: Negative for ear discharge, ear pain, hearing loss and tinnitus.   Eyes: Negative for photophobia and pain.  Respiratory: Negative for cough and shortness of breath.   Cardiovascular: Negative for chest pain.  Gastrointestinal: Positive for nausea. Negative for abdominal pain and vomiting.  Genitourinary: Negative for dysuria, flank pain, frequency and urgency.  Musculoskeletal: Positive  for arthralgias (Left hip). Negative for back pain, myalgias and neck pain.  Neurological: Negative for dizziness and headaches.  Hematological: Does not bruise/bleed easily.  Psychiatric/Behavioral: The patient is not nervous/anxious.    Blood pressure (!) 171/81, pulse 84, temperature 98.7 F (37.1 C), temperature source Oral, resp. rate (!) 22, height 5\' 1"  (1.549 m), weight 32.7 kg, SpO2 97 %. Physical Exam Constitutional:      General: She is not in acute distress.    Appearance: She is well-developed. She is not diaphoretic.  HENT:     Head: Normocephalic and atraumatic.  Eyes:     General: No scleral icterus.       Right eye: No discharge.        Left eye: No discharge.      Conjunctiva/sclera: Conjunctivae normal.  Cardiovascular:     Rate and Rhythm: Normal rate and regular rhythm.  Pulmonary:     Effort: Pulmonary effort is normal. No respiratory distress.  Musculoskeletal:     Cervical back: Normal range of motion.     Comments: LLE No traumatic wounds, ecchymosis, or rash  TTP hip  No knee or ankle effusion  Knee stable to varus/ valgus and anterior/posterior stress  Sens DPN, SPN, TN intact  Motor EHL, ext, flex, evers 5/5  DP 1+, PT 1+, No significant edema  Skin:    General: Skin is warm and dry.  Neurological:     Mental Status: She is alert.  Psychiatric:        Behavior: Behavior normal.     Assessment/Plan: Left hip fx -- Plan hip hemi tomorrow AM by Dr. [name]. Please keep NPO after MN. Multiple medical problems including hypercholesteremia, HTN, CKD (stage IV), multiple TIAs, CAD, anemia, a stroke, abnormal weight loss, and osteoarthritis -- per primary service who will admit, manage, and clear. Appreciate their help. Please hold Plavix.    Lisette Abu, PA-C Orthopedic Surgery [phone] 03/21/2020, 12:01 PM

## 2020-03-21 NOTE — ED Triage Notes (Addendum)
Pt BIB [facility] for dizzyness with fall last night resulting in pain to neck,  left hip and knee.  EMS reports shortening to left leg.  Pt hit head w/o LOC and takes plavix. Pt is A&O x4. Pt refused to come to hospital last night but could not bear weight on left leg this morning and pain was worse so agreed to transport this morning.

## 2020-03-21 NOTE — ED Notes (Signed)
Patient transported to X-ray 

## 2020-03-21 NOTE — ED Notes (Signed)
Attempted report. Left number.  Awaiting callback.

## 2020-03-21 NOTE — ED Notes (Signed)
Spoke with son Jenelle Drennon and updated pt's status.  Son will call back to speak directly with pt.

## 2020-03-21 NOTE — ED Notes (Signed)
Pt found sitting at end of bed stating she felt sick and needed to urinate.  Pt returned to bed, placed on purewick and given 4mg  of Zofran.

## 2020-03-21 NOTE — ED Provider Notes (Signed)
[facility] EMERGENCY DEPARTMENT Provider Note   CSN: [id number] Arrival date & time: 03/21/20  0744     History Chief Complaint  Patient presents with   Hip Injury   Fall   Knee Pain    Melissa Noble is a 86 y.o. female.  86 year old female brought in by EMS from [facility] with left hip pain. Patient states she was getting into bed last night when she had pain in her left hip and fell. Patient is unclear on the details of her fall, denies feeling weak or dizzy, states that she did hit her head but did not loose consciousness. Patient's husband was able to help her get into bed at that time. Patient states she woke up at night to go to the bathroom but was unable to ambulate to the bathroom because of pain in her left hip, had an accident and went back to bed. Patient called EMS this morning when she continued to have pain in her left hip. Patient is on Plavix, took 2 81mg  ASA for her pain this morning and declines further pain medicine at this time. No prior hip injuries. Denies any other injuries or concerns.         Past Medical History:  Diagnosis Date   Coronary artery disease    Hypertension     There are no problems to display for this patient.   History as reviewed in care-everywhere   OB History   No obstetric history on file.     No family history on file.  Social History   Tobacco Use   Smoking status: Former Smoker   Smokeless tobacco: Never Used   Tobacco comment: quit at age 61  Vaping Use   Vaping Use: Never used  Substance Use Topics   Alcohol use: Not Currently   Drug use: Not Currently    Home Medications Prior to Admission medications   Medication Sig Start Date End Date Taking? Authorizing Provider  clopidogrel (PLAVIX) 75 MG tablet Take 75 mg by mouth daily. 01/01/20  Yes [provider]  cycloSPORINE (RESTASIS) 0.05 % ophthalmic emulsion Place 1 drop into both eyes 2 (two) times daily. 8am and  8pm 05/15/18  Yes [provider]  hydrochlorothiazide (HYDRODIURIL) 12.5 MG tablet Take 12.5 mg by mouth daily. 01/01/20  Yes [provider]  levocetirizine (XYZAL) 5 MG tablet Take 5 mg by mouth daily. 01/01/20  Yes [provider]  losartan (COZAAR) 100 MG tablet Take 100 mg by mouth daily. 01/01/20  Yes [provider]  Multiple Vitamins-Minerals (CENTRUM SILVER) tablet Take 1 tablet by mouth daily. 08/11/11  Yes [provider]  nitroGLYCERIN (NITROSTAT) 0.4 MG SL tablet Place 0.4 mg under the tongue every 5 (five) minutes as needed for chest pain.  01/09/20  Yes [provider]  olopatadine (PATANOL) 0.1 % ophthalmic solution Place 1 drop into both eyes daily. 03/03/20  Yes [provider]  oxybutynin (DITROPAN-XL) 10 MG 24 hr tablet Take 10 mg by mouth daily. 01/09/20  Yes [provider]  Polyethylene Glycol 400 0.25 % SOLN Place 1 drop into both eyes at bedtime. 04/19/18  Yes [provider]  PREDNISOLONE ACETATE P-F 1 % ophthalmic suspension Place 1 drop into both eyes 3 (three) times daily. 8am, 2pm, and 8pm 03/03/20  Yes [provider]  simvastatin (ZOCOR) 20 MG tablet Take 20 mg by mouth at bedtime. 01/01/20  Yes [provider]    Allergies  Ticlopidine, Depakote [divalproex sodium], and Penicillins  Review of Systems   Review of Systems  Constitutional: Negative for fever.  Respiratory: Negative for shortness of breath.   Cardiovascular: Negative for chest pain.  Gastrointestinal: Negative for nausea and vomiting.  Musculoskeletal: Positive for arthralgias and gait problem. Negative for back pain and neck pain.  Skin: Negative for rash and wound.  Neurological: Negative for dizziness, weakness and light-headedness.  Hematological: Bruises/bleeds easily.  Psychiatric/Behavioral: Negative for confusion.  All other systems reviewed and are negative.   Physical Exam Updated Vital  Signs BP (!) 171/81 (BP Location: Right Arm)    Pulse 84    Temp 98.7 F (37.1 C) (Oral)    Resp (!) 22    Ht 5\' 1"  (1.549 m)    Wt 32.7 kg    SpO2 97%    BMI 13.60 kg/m   Physical Exam Vitals and nursing note reviewed.  Constitutional:      General: She is not in acute distress.    Appearance: She is well-developed. She is not diaphoretic.  HENT:     Head: Normocephalic and atraumatic.  Eyes:     Extraocular Movements: Extraocular movements intact.     Pupils: Pupils are equal, round, and reactive to light.  Cardiovascular:     Rate and Rhythm: Normal rate and regular rhythm.     Pulses: Normal pulses.     Heart sounds: Normal heart sounds.  Pulmonary:     Effort: Pulmonary effort is normal.     Breath sounds: Normal breath sounds.  Abdominal:     Palpations: Abdomen is soft.     Tenderness: There is no abdominal tenderness.  Musculoskeletal:        General: Tenderness present.     Cervical back: No tenderness or bony tenderness.     Comments: Left leg shortened and externally rotated. DP pulse present, sensation intact. Pain with palpation over left pelvis and with movement of left hip.  No pain with palpation or ROM upper extremities.   C-collar in place.   Skin:    General: Skin is warm and dry.     Findings: No erythema or rash.  Neurological:     Mental Status: She is alert and oriented to person, place, and time.     Sensory: No sensory deficit.  Psychiatric:        Behavior: Behavior normal.     ED Results / Procedures / Treatments   Labs (all labs ordered are listed, but only abnormal results are displayed) Labs Reviewed  COMPREHENSIVE METABOLIC PANEL - Abnormal; Notable for the following components:      Result Value   CO2 19 (*)    Glucose, Bld 125 (*)    BUN 53 (*)    Creatinine, Ser 1.98 (*)    Total Protein 6.1 (*)    Albumin 3.4 (*)    GFR calc non Af Amer 21 (*)    All other components within normal limits  CBC WITH DIFFERENTIAL/PLATELET -  Abnormal; Notable for the following components:   WBC 14.0 (*)    RBC 3.22 (*)    Hemoglobin 10.1 (*)    HCT 30.9 (*)    Neutro Abs 11.9 (*)    Abs Immature Granulocytes 0.08 (*)    All other components within normal limits  RESPIRATORY PANEL BY RT PCR (FLU A&B, COVID)  PROTIME-INR    EKG None  Radiology DG Chest 1 View  Result Date: 03/21/2020 CLINICAL DATA:  Pain  following fall EXAM: CHEST  1 VIEW COMPARISON:  None. FINDINGS: Lungs are clear. Heart size and pulmonary vascularity are normal. No adenopathy. There is aortic atherosclerosis. There is calcification in each carotid artery with a stent in the left carotid artery region. There is no evident pneumothorax. No fracture. Bones are osteoporotic. IMPRESSION: Lungs clear. Cardiac silhouette normal. No pneumothorax. Aortic atherosclerosis as well as carotid artery calcification bilaterally. Bones osteoporotic. Aortic Atherosclerosis (ICD10-I70.0). Electronically Signed   By: Lowella Grip III M.D.   On: 03/21/2020 08:58   CT Head Wo Contrast  Result Date: 03/21/2020 CLINICAL DATA:  Fall on blood thinners.  Head trauma.  Neck pain. EXAM: CT HEAD WITHOUT CONTRAST CT CERVICAL SPINE WITHOUT CONTRAST TECHNIQUE: Multidetector CT imaging of the head and cervical spine was performed following the standard protocol without intravenous contrast. Multiplanar CT image reconstructions of the cervical spine were also generated. COMPARISON:  None. FINDINGS: CT HEAD FINDINGS Brain: No evidence of acute infarction, hemorrhage, hydrocephalus, extra-axial collection or mass lesion/mass effect. Right temporal lobe encephalomalacia compatible with remote ischemia. Extensive low-density changes within the periventricular and subcortical white matter compatible with chronic microvascular ischemic change. Mild diffuse cerebral volume loss. Vascular: Atherosclerotic calcifications involving the large vessels of the skull base. No unexpected hyperdense vessel.  Skull: Normal. Negative for fracture or focal lesion. Sinuses/Orbits: No acute finding. Other: None. CT CERVICAL SPINE FINDINGS Alignment: Facet joints are aligned without dislocation or traumatic listhesis. Dens and lateral masses are aligned. Trace retrolisthesis C5 on C6. Skull base and vertebrae: Subtle superior endplate depression of the T2 vertebral body where there is a small superior endplate Schmorl's node. Findings are favored chronic. Elsewhere. No evidence of acute fracture. No suspicious bone lesion. Soft tissues and spinal canal: No prevertebral fluid or swelling. No visible canal hematoma. Disc levels: Advanced degenerative disc disease of C5-6 with prominent endplate spurring and uncovertebral arthropathy. Remaining disc heights are relatively preserved. There is multilevel bilateral advanced facet arthropathy throughout the cervical spine. Upper chest: Mild biapical pleuroparenchymal scarring within the visualized lung apices. Other: Heterogeneous thyroid containing numerous small nodules. Left-sided carotid stent. IMPRESSION: CT head: 1. No CT evidence of acute intracranial process. 2. Right temporal lobe encephalomalacia compatible with remote ischemia. 3. Chronic microvascular ischemic change and cerebral volume loss. CT cervical spine: 1. No evidence of acute fracture or traumatic listhesis of the cervical spine. 2. Subtle superior endplate depression of the T2 vertebral body where there is a small endplate Schmorl's node. Findings are favored chronic/degenerative. Correlate for point tenderness at this level. 3. Advanced degenerative disc disease of C5-6 and advanced multilevel facet arthropathy. 4. Heterogeneous thyroid containing numerous small nodules. In the setting of significant comorbidities or limited life expectancy, no follow-up recommended (ref: J Am Coll Radiol. 2015 Feb;12(2): 143-50). Electronically Signed   By: Davina Poke D.O.   On: 03/21/2020 09:26   CT Cervical Spine Wo  Contrast  Result Date: 03/21/2020 CLINICAL DATA:  Fall on blood thinners.  Head trauma.  Neck pain. EXAM: CT HEAD WITHOUT CONTRAST CT CERVICAL SPINE WITHOUT CONTRAST TECHNIQUE: Multidetector CT imaging of the head and cervical spine was performed following the standard protocol without intravenous contrast. Multiplanar CT image reconstructions of the cervical spine were also generated. COMPARISON:  None. FINDINGS: CT HEAD FINDINGS Brain: No evidence of acute infarction, hemorrhage, hydrocephalus, extra-axial collection or mass lesion/mass effect. Right temporal lobe encephalomalacia compatible with remote ischemia. Extensive low-density changes within the periventricular and subcortical white matter compatible with chronic microvascular ischemic change. Mild  diffuse cerebral volume loss. Vascular: Atherosclerotic calcifications involving the large vessels of the skull base. No unexpected hyperdense vessel. Skull: Normal. Negative for fracture or focal lesion. Sinuses/Orbits: No acute finding. Other: None. CT CERVICAL SPINE FINDINGS Alignment: Facet joints are aligned without dislocation or traumatic listhesis. Dens and lateral masses are aligned. Trace retrolisthesis C5 on C6. Skull base and vertebrae: Subtle superior endplate depression of the T2 vertebral body where there is a small superior endplate Schmorl's node. Findings are favored chronic. Elsewhere. No evidence of acute fracture. No suspicious bone lesion. Soft tissues and spinal canal: No prevertebral fluid or swelling. No visible canal hematoma. Disc levels: Advanced degenerative disc disease of C5-6 with prominent endplate spurring and uncovertebral arthropathy. Remaining disc heights are relatively preserved. There is multilevel bilateral advanced facet arthropathy throughout the cervical spine. Upper chest: Mild biapical pleuroparenchymal scarring within the visualized lung apices. Other: Heterogeneous thyroid containing numerous small nodules.  Left-sided carotid stent. IMPRESSION: CT head: 1. No CT evidence of acute intracranial process. 2. Right temporal lobe encephalomalacia compatible with remote ischemia. 3. Chronic microvascular ischemic change and cerebral volume loss. CT cervical spine: 1. No evidence of acute fracture or traumatic listhesis of the cervical spine. 2. Subtle superior endplate depression of the T2 vertebral body where there is a small endplate Schmorl's node. Findings are favored chronic/degenerative. Correlate for point tenderness at this level. 3. Advanced degenerative disc disease of C5-6 and advanced multilevel facet arthropathy. 4. Heterogeneous thyroid containing numerous small nodules. In the setting of significant comorbidities or limited life expectancy, no follow-up recommended (ref: J Am Coll Radiol. 2015 Feb;12(2): 143-50). Electronically Signed   By: Davina Poke D.O.   On: 03/21/2020 09:26   DG Hip Unilat With Pelvis 2-3 Views Left  Result Date: 03/21/2020 CLINICAL DATA:  Pain following fall EXAM: DG HIP (WITH OR WITHOUT PELVIS) 2-3V LEFT COMPARISON:  None. FINDINGS: Frontal pelvis as well as frontal and lateral left hip images were obtained. There is a subcapital femoral neck fracture on the left with impaction and mild varus angulation at the fracture site. No other fracture. No dislocation. Bones are osteoporotic. There is mild symmetric narrowing of each hip joint. No erosion. There are multiple foci of arterial vascular calcification IMPRESSION: Left subcapital femoral neck fracture with mild varus angulation and impaction at the fracture site. No other fracture. No dislocation. Symmetric narrowing of each hip joint. Bones diffusely osteoporotic. Multiple foci of arterial vascular calcification noted. Electronically Signed   By: Lowella Grip III M.D.   On: 03/21/2020 08:57    Procedures Procedures (including critical care time)  Medications Ordered in ED Medications  ondansetron (ZOFRAN)  injection 4 mg (4 mg Intravenous Given 03/21/20 0910)  HYDROmorphone (DILAUDID) injection 0.5 mg (0.5 mg Intravenous Given 03/21/20 0910)  ondansetron (ZOFRAN) injection 4 mg (4 mg Intravenous Given 03/21/20 1149)    ED Course  I have reviewed the triage vital signs and the nursing notes.  Pertinent labs & imaging results that were available during my care of the patient were reviewed by me and considered in my medical decision making (see chart for details).  Clinical Course as of Mar 21 1248  Fri Mar 21, 3628  [id number] 86 year old female brought in by EMS from [facility] for left hip pain with a fall.  Patient is awake, alert, found to have shortened and externally rotated left leg with pain over the left hip.  Patient is on Plavix, states that she did hit her head but did not have  loss of consciousness.  Plan is to CT head, C-spine as well as x-ray her hip suspecting hip fracture.  Review of records in care everywhere, patient with extensive medical history including stage IV chronic kidney disease.  I have asked the nurse to crossover patient's medical history.  Review of lab work, CBC with mildly elevated WBC at 14, hemoglobin 10.1 (prior records report anemia from chronic kidney disease).  CMP with creatinine of 1.98, history of stage IV chronic kidney disease.  X-ray shows left hip fracture, case discussed with Hilbert Odor, PA-C on-call with orthopedics who will consult.   [LM]  1215 Patient was seen by orthopedics, plan for surgery tomorrow (on plavix). Hospitalist paged for consult.    [LM]  6203 Case discussed with teaching service who will consult for admission.    [LM]    Clinical Course User Index [LM] Roque Lias   MDM Rules/Calculators/A&P                          Final Clinical Impression(s) / ED Diagnoses Final diagnoses:  Closed fracture of left hip, initial encounter [facility])    Rx / DC Orders ED Discharge Orders    None       Tacy Learn,  PA-C 03/21/20 1249    Lajean Saver, MD 03/25/20 1546

## 2020-03-22 ENCOUNTER — Encounter (HOSPITAL_COMMUNITY)
Admission: EM | Disposition: A | Discharge status: Skilled Nursing Facility | Payer: Self-pay | Source: Skilled Nursing Facility | Attending: Internal Medicine

## 2020-03-22 ENCOUNTER — Encounter (HOSPITAL_COMMUNITY): Payer: Self-pay | Admitting: Internal Medicine

## 2020-03-22 ENCOUNTER — Inpatient Hospital Stay (HOSPITAL_COMMUNITY): Payer: Medicare Other

## 2020-03-22 ENCOUNTER — Inpatient Hospital Stay (HOSPITAL_COMMUNITY): Payer: Medicare Other | Admitting: Anesthesiology

## 2020-03-22 DIAGNOSIS — I2510 Atherosclerotic heart disease of native coronary artery without angina pectoris: Secondary | ICD-10-CM

## 2020-03-22 DIAGNOSIS — Z8673 Personal history of transient ischemic attack (TIA), and cerebral infarction without residual deficits: Secondary | ICD-10-CM

## 2020-03-22 DIAGNOSIS — R410 Disorientation, unspecified: Secondary | ICD-10-CM | POA: Diagnosis not present

## 2020-03-22 DIAGNOSIS — R4182 Altered mental status, unspecified: Secondary | ICD-10-CM

## 2020-03-22 DIAGNOSIS — I6521 Occlusion and stenosis of right carotid artery: Secondary | ICD-10-CM

## 2020-03-22 DIAGNOSIS — W1839XA Other fall on same level, initial encounter: Secondary | ICD-10-CM

## 2020-03-22 DIAGNOSIS — Z96649 Presence of unspecified artificial hip joint: Secondary | ICD-10-CM

## 2020-03-22 DIAGNOSIS — I129 Hypertensive chronic kidney disease with stage 1 through stage 4 chronic kidney disease, or unspecified chronic kidney disease: Secondary | ICD-10-CM

## 2020-03-22 DIAGNOSIS — N184 Chronic kidney disease, stage 4 (severe): Secondary | ICD-10-CM

## 2020-03-22 DIAGNOSIS — E785 Hyperlipidemia, unspecified: Secondary | ICD-10-CM

## 2020-03-22 HISTORY — PX: HIP ARTHROPLASTY: SHX981

## 2020-03-22 LAB — MRSA PCR SCREENING: MRSA by PCR: NEGATIVE

## 2020-03-22 LAB — ABO/RH: ABO/RH(D): A POS

## 2020-03-22 LAB — PREPARE RBC (CROSSMATCH)

## 2020-03-22 SURGERY — HEMIARTHROPLASTY, HIP, DIRECT ANTERIOR APPROACH, FOR FRACTURE
Anesthesia: General | Site: Hip | Laterality: Left

## 2020-03-22 MED ORDER — ONDANSETRON HCL 4 MG/2ML IJ SOLN: 4.0000 mg | Freq: Once | INTRAMUSCULAR | Status: DC | PRN

## 2020-03-22 MED ORDER — GLYCOPYRROLATE PF 0.2 MG/ML IJ SOSY
PREFILLED_SYRINGE | INTRAMUSCULAR | Status: AC
  Filled 2020-03-22: qty 1

## 2020-03-22 MED ORDER — GLYCOPYRROLATE PF 0.2 MG/ML IJ SOSY
PREFILLED_SYRINGE | INTRAMUSCULAR | Status: DC | PRN
  Administered 2020-03-22 (×2): .2 mg via INTRAVENOUS

## 2020-03-22 MED ORDER — DOCUSATE SODIUM 100 MG PO CAPS
100.0000 mg | ORAL_CAPSULE | Freq: Two times a day (BID) | ORAL | Status: DC
  Administered 2020-03-22 – 2020-03-25 (×7): 100 mg via ORAL
  Filled 2020-03-22 (×7): qty 1

## 2020-03-22 MED ORDER — MORPHINE SULFATE (PF) 2 MG/ML IV SOLN: 0.5000 mg | INTRAVENOUS | Status: DC | PRN

## 2020-03-22 MED ORDER — FENTANYL CITRATE (PF) 250 MCG/5ML IJ SOLN
INTRAMUSCULAR | Status: DC | PRN
Start: 2020-03-22 — End: 2020-03-22
  Administered 2020-03-22 (×4): 25 ug via INTRAVENOUS

## 2020-03-22 MED ORDER — PHENOL 1.4 % MT LIQD: 1.0000 | OROMUCOSAL | Status: DC | PRN

## 2020-03-22 MED ORDER — PHENYLEPHRINE HCL-NACL 10-0.9 MG/250ML-% IV SOLN
INTRAVENOUS | Status: DC | PRN
  Administered 2020-03-22: 40 ug/min via INTRAVENOUS

## 2020-03-22 MED ORDER — DEXAMETHASONE SODIUM PHOSPHATE 10 MG/ML IJ SOLN
INTRAMUSCULAR | Status: DC | PRN
  Administered 2020-03-22: 4 mg via INTRAVENOUS

## 2020-03-22 MED ORDER — METOCLOPRAMIDE HCL 5 MG PO TABS: 5.0000 mg | ORAL_TABLET | Freq: Three times a day (TID) | ORAL | Status: DC | PRN

## 2020-03-22 MED ORDER — CEFAZOLIN SODIUM-DEXTROSE 2-4 GM/100ML-% IV SOLN
INTRAVENOUS | Status: AC
  Filled 2020-03-22: qty 100

## 2020-03-22 MED ORDER — FENTANYL CITRATE (PF) 250 MCG/5ML IJ SOLN
INTRAMUSCULAR | Status: AC
  Filled 2020-03-22: qty 5

## 2020-03-22 MED ORDER — NALOXONE HCL 0.4 MG/ML IJ SOLN: 0.4000 mg | INTRAMUSCULAR | Status: DC | PRN

## 2020-03-22 MED ORDER — FENTANYL CITRATE (PF) 100 MCG/2ML IJ SOLN: 25.0000 ug | INTRAMUSCULAR | Status: DC | PRN

## 2020-03-22 MED ORDER — NEOSTIGMINE METHYLSULFATE 3 MG/3ML IV SOSY
PREFILLED_SYRINGE | INTRAVENOUS | Status: AC
  Filled 2020-03-22: qty 3

## 2020-03-22 MED ORDER — LACTATED RINGERS IV SOLN: INTRAVENOUS | Status: AC

## 2020-03-22 MED ORDER — SODIUM CHLORIDE 0.9 % IV SOLN: 10.0000 mL/h | Freq: Once | INTRAVENOUS | Status: DC

## 2020-03-22 MED ORDER — LACTATED RINGERS IV SOLN: INTRAVENOUS | Status: DC | PRN

## 2020-03-22 MED ORDER — DEXAMETHASONE SODIUM PHOSPHATE 10 MG/ML IJ SOLN
INTRAMUSCULAR | Status: AC
  Filled 2020-03-22: qty 1

## 2020-03-22 MED ORDER — HYDROCODONE-ACETAMINOPHEN 5-325 MG PO TABS
1.0000 | ORAL_TABLET | ORAL | Status: DC | PRN
  Administered 2020-03-23 – 2020-03-25 (×3): 1 via ORAL
  Filled 2020-03-22 (×3): qty 1

## 2020-03-22 MED ORDER — LIDOCAINE 2% (20 MG/ML) 5 ML SYRINGE
INTRAMUSCULAR | Status: AC
  Filled 2020-03-22: qty 5

## 2020-03-22 MED ORDER — CLOPIDOGREL BISULFATE 75 MG PO TABS
75.0000 mg | ORAL_TABLET | Freq: Every day | ORAL | Status: DC
  Administered 2020-03-22 – 2020-03-25 (×4): 75 mg via ORAL
  Filled 2020-03-22 (×4): qty 1

## 2020-03-22 MED ORDER — METHOCARBAMOL 1000 MG/10ML IJ SOLN
500.0000 mg | Freq: Four times a day (QID) | INTRAVENOUS | Status: DC | PRN
  Filled 2020-03-22: qty 5

## 2020-03-22 MED ORDER — PROPOFOL 10 MG/ML IV BOLUS
INTRAVENOUS | Status: AC
  Filled 2020-03-22: qty 20

## 2020-03-22 MED ORDER — PHENYLEPHRINE 40 MCG/ML (10ML) SYRINGE FOR IV PUSH (FOR BLOOD PRESSURE SUPPORT)
PREFILLED_SYRINGE | INTRAVENOUS | Status: AC
  Filled 2020-03-22: qty 10

## 2020-03-22 MED ORDER — ACETAMINOPHEN 10 MG/ML IV SOLN
INTRAVENOUS | Status: DC | PRN
  Administered 2020-03-22: 500 mg via INTRAVENOUS

## 2020-03-22 MED ORDER — ONDANSETRON HCL 4 MG PO TABS: 4.0000 mg | ORAL_TABLET | Freq: Four times a day (QID) | ORAL | Status: DC | PRN

## 2020-03-22 MED ORDER — TRANEXAMIC ACID-NACL 1000-0.7 MG/100ML-% IV SOLN
INTRAVENOUS | Status: AC
  Filled 2020-03-22: qty 100

## 2020-03-22 MED ORDER — OXYCODONE HCL 5 MG PO TABS: 5.0000 mg | ORAL_TABLET | Freq: Once | ORAL | Status: DC | PRN

## 2020-03-22 MED ORDER — EPHEDRINE 5 MG/ML INJ
INTRAVENOUS | Status: AC
  Filled 2020-03-22: qty 10

## 2020-03-22 MED ORDER — CEFAZOLIN SODIUM-DEXTROSE 2-3 GM-%(50ML) IV SOLR
INTRAVENOUS | Status: DC | PRN
  Administered 2020-03-22: 2 g via INTRAVENOUS

## 2020-03-22 MED ORDER — VANCOMYCIN HCL 1000 MG IV SOLR
INTRAVENOUS | Status: DC | PRN
  Administered 2020-03-22: 1000 mg via TOPICAL

## 2020-03-22 MED ORDER — METOCLOPRAMIDE HCL 5 MG/ML IJ SOLN: 5.0000 mg | Freq: Three times a day (TID) | INTRAMUSCULAR | Status: DC | PRN

## 2020-03-22 MED ORDER — TRANEXAMIC ACID-NACL 1000-0.7 MG/100ML-% IV SOLN
INTRAVENOUS | Status: DC | PRN
  Administered 2020-03-22: 1000 mg via INTRAVENOUS

## 2020-03-22 MED ORDER — ACETAMINOPHEN 10 MG/ML IV SOLN
INTRAVENOUS | Status: AC
  Filled 2020-03-22: qty 100

## 2020-03-22 MED ORDER — NALOXONE HCL 0.4 MG/ML IJ SOLN
INTRAMUSCULAR | Status: AC
  Filled 2020-03-22: qty 1

## 2020-03-22 MED ORDER — METHOCARBAMOL 500 MG PO TABS
500.0000 mg | ORAL_TABLET | Freq: Four times a day (QID) | ORAL | Status: DC | PRN
  Administered 2020-03-22 – 2020-03-25 (×5): 500 mg via ORAL
  Filled 2020-03-22 (×6): qty 1

## 2020-03-22 MED ORDER — PHENYLEPHRINE 40 MCG/ML (10ML) SYRINGE FOR IV PUSH (FOR BLOOD PRESSURE SUPPORT)
PREFILLED_SYRINGE | INTRAVENOUS | Status: DC | PRN
  Administered 2020-03-22: 40 ug via INTRAVENOUS
  Administered 2020-03-22: 80 ug via INTRAVENOUS

## 2020-03-22 MED ORDER — VANCOMYCIN HCL 1000 MG IV SOLR
INTRAVENOUS | Status: AC
  Filled 2020-03-22: qty 1000

## 2020-03-22 MED ORDER — VANCOMYCIN HCL IN DEXTROSE 1-5 GM/200ML-% IV SOLN
1000.0000 mg | Freq: Two times a day (BID) | INTRAVENOUS | Status: AC
  Administered 2020-03-22: 1000 mg via INTRAVENOUS
  Filled 2020-03-22: qty 200

## 2020-03-22 MED ORDER — ALBUMIN HUMAN 5 % IV SOLN: INTRAVENOUS | Status: DC | PRN

## 2020-03-22 MED ORDER — NALOXONE HCL 0.4 MG/ML IJ SOLN: 0.4000 mg | Freq: Once | INTRAMUSCULAR | Status: DC

## 2020-03-22 MED ORDER — PROPOFOL 10 MG/ML IV BOLUS
INTRAVENOUS | Status: DC | PRN
  Administered 2020-03-22: 50 mg via INTRAVENOUS

## 2020-03-22 MED ORDER — NALOXONE HCL 4 MG/10ML IJ SOLN: 0.5000 mg/h | INTRAVENOUS | Status: DC

## 2020-03-22 MED ORDER — HYDROMORPHONE HCL 1 MG/ML IJ SOLN
0.5000 mg | Freq: Once | INTRAMUSCULAR | Status: AC
  Administered 2020-03-22: 0.5 mg via INTRAVENOUS

## 2020-03-22 MED ORDER — ONDANSETRON HCL 4 MG/2ML IJ SOLN
INTRAMUSCULAR | Status: DC | PRN
  Administered 2020-03-22: 4 mg via INTRAVENOUS

## 2020-03-22 MED ORDER — ACETAMINOPHEN 500 MG PO TABS
500.0000 mg | ORAL_TABLET | Freq: Four times a day (QID) | ORAL | Status: AC
  Administered 2020-03-22 – 2020-03-23 (×4): 500 mg via ORAL
  Filled 2020-03-22 (×4): qty 1

## 2020-03-22 MED ORDER — MENTHOL 3 MG MT LOZG: 1.0000 | LOZENGE | OROMUCOSAL | Status: DC | PRN

## 2020-03-22 MED ORDER — 0.9 % SODIUM CHLORIDE (POUR BTL) OPTIME
TOPICAL | Status: DC | PRN
  Administered 2020-03-22: 1000 mL

## 2020-03-22 MED ORDER — EPHEDRINE SULFATE-NACL 50-0.9 MG/10ML-% IV SOSY
PREFILLED_SYRINGE | INTRAVENOUS | Status: DC | PRN
  Administered 2020-03-22: 5 mg via INTRAVENOUS

## 2020-03-22 MED ORDER — LIDOCAINE 2% (20 MG/ML) 5 ML SYRINGE
INTRAMUSCULAR | Status: DC | PRN
  Administered 2020-03-22: 20 mg via INTRAVENOUS

## 2020-03-22 MED ORDER — ROCURONIUM BROMIDE 10 MG/ML (PF) SYRINGE
PREFILLED_SYRINGE | INTRAVENOUS | Status: AC
  Filled 2020-03-22: qty 10

## 2020-03-22 MED ORDER — HYDROCODONE-ACETAMINOPHEN 5-325 MG PO TABS: 1.0000 | ORAL_TABLET | ORAL | Status: DC | PRN

## 2020-03-22 MED ORDER — ONDANSETRON HCL 4 MG/2ML IJ SOLN
INTRAMUSCULAR | Status: AC
  Filled 2020-03-22: qty 2

## 2020-03-22 MED ORDER — SODIUM CHLORIDE 0.9 % IR SOLN
Status: DC | PRN
  Administered 2020-03-22: 3000 mL

## 2020-03-22 MED ORDER — NEOSTIGMINE METHYLSULFATE 3 MG/3ML IV SOSY
PREFILLED_SYRINGE | INTRAVENOUS | Status: DC | PRN
  Administered 2020-03-22: 2 mg via INTRAVENOUS

## 2020-03-22 MED ORDER — NALOXONE HCL 0.4 MG/ML IJ SOLN
0.4000 mg | Freq: Once | INTRAMUSCULAR | Status: AC
  Administered 2020-03-22 (×2): .4 mg via INTRAVENOUS

## 2020-03-22 MED ORDER — OXYCODONE HCL 5 MG/5ML PO SOLN: 5.0000 mg | Freq: Once | ORAL | Status: DC | PRN

## 2020-03-22 MED ORDER — ONDANSETRON HCL 4 MG/2ML IJ SOLN
4.0000 mg | Freq: Four times a day (QID) | INTRAMUSCULAR | Status: DC | PRN
  Administered 2020-03-24: 4 mg via INTRAVENOUS
  Filled 2020-03-22: qty 2

## 2020-03-22 MED ORDER — ROCURONIUM BROMIDE 10 MG/ML (PF) SYRINGE
PREFILLED_SYRINGE | INTRAVENOUS | Status: DC | PRN
  Administered 2020-03-22: 30 mg via INTRAVENOUS

## 2020-03-22 SURGICAL SUPPLY — 59 items
BIPOLAR PROS AML 45 (Hips) ×2 IMPLANT
BIPOLAR PROS AML 45MM (Hips) ×1 IMPLANT
BRUSH FEMORAL CANAL (MISCELLANEOUS) IMPLANT
CLOSURE STERI-STRIP 1/2X4 (GAUZE/BANDAGES/DRESSINGS) ×1
CLSR STERI-STRIP ANTIMIC 1/2X4 (GAUZE/BANDAGES/DRESSINGS) ×2 IMPLANT
COVER BACK TABLE 24X17X13 BIG (DRAPES) ×3 IMPLANT
COVER SURGICAL LIGHT HANDLE (MISCELLANEOUS) ×3 IMPLANT
DRAPE C-ARM 42X72 X-RAY (DRAPES) ×3 IMPLANT
DRAPE IMP U-DRAPE 54X76 (DRAPES) ×3 IMPLANT
DRAPE ORTHO SPLIT 77X108 STRL (DRAPES) ×4
DRAPE STERI IOBAN 125X83 (DRAPES) ×3 IMPLANT
DRAPE SURG ORHT 6 SPLT 77X108 (DRAPES) ×2 IMPLANT
DRAPE U-SHAPE 47X51 STRL (DRAPES) ×3 IMPLANT
DRSG AQUACEL AG ADV 3.5X10 (GAUZE/BANDAGES/DRESSINGS) ×3 IMPLANT
DRSG PAD ABDOMINAL 8X10 ST (GAUZE/BANDAGES/DRESSINGS) IMPLANT
DURAPREP 26ML APPLICATOR (WOUND CARE) ×3 IMPLANT
ELECT CAUTERY BLADE 6.4 (BLADE) ×3 IMPLANT
ELECT REM PT RETURN 9FT ADLT (ELECTROSURGICAL) ×3
ELECTRODE REM PT RTRN 9FT ADLT (ELECTROSURGICAL) ×1 IMPLANT
GAUZE SPONGE 4X4 12PLY STRL (GAUZE/BANDAGES/DRESSINGS) IMPLANT
GAUZE XEROFORM 5X9 LF (GAUZE/BANDAGES/DRESSINGS) IMPLANT
GLOVE BIOGEL PI IND STRL 8 (GLOVE) ×1 IMPLANT
GLOVE BIOGEL PI INDICATOR 8 (GLOVE) ×2
GLOVE ECLIPSE 8.0 STRL XLNG CF (GLOVE) ×3 IMPLANT
GOWN STRL REUS W/ TWL LRG LVL3 (GOWN DISPOSABLE) ×1 IMPLANT
GOWN STRL REUS W/ TWL XL LVL3 (GOWN DISPOSABLE) ×2 IMPLANT
GOWN STRL REUS W/TWL LRG LVL3 (GOWN DISPOSABLE) ×2
GOWN STRL REUS W/TWL XL LVL3 (GOWN DISPOSABLE) ×4
HANDPIECE INTERPULSE COAX TIP (DISPOSABLE)
HEAD BIPOLAR PROS AML 45 (Hips) ×1 IMPLANT
HEAD FEM STD 28X+1.5 STRL (Hips) ×3 IMPLANT
IMMOBILIZER KNEE 20 (SOFTGOODS)
IMMOBILIZER KNEE 20 THIGH 36 (SOFTGOODS) IMPLANT
KIT BASIN OR (CUSTOM PROCEDURE TRAY) ×3 IMPLANT
KIT TURNOVER KIT B (KITS) ×3 IMPLANT
MANIFOLD NEPTUNE II (INSTRUMENTS) ×3 IMPLANT
NDL SUT .5 MAYO 1.404X.05X (NEEDLE) IMPLANT
NEEDLE HYPO 25GX1X1/2 BEV (NEEDLE) IMPLANT
NEEDLE MAYO TAPER (NEEDLE)
NS IRRIG 1000ML POUR BTL (IV SOLUTION) ×6 IMPLANT
PACK TOTAL JOINT (CUSTOM PROCEDURE TRAY) ×3 IMPLANT
PACK UNIVERSAL I (CUSTOM PROCEDURE TRAY) ×3 IMPLANT
PAD ARMBOARD 7.5X6 YLW CONV (MISCELLANEOUS) ×6 IMPLANT
PILLOW ABDUCTION MEDIUM (MISCELLANEOUS) IMPLANT
SET HNDPC FAN SPRY TIP SCT (DISPOSABLE) IMPLANT
STAPLER VISISTAT 35W (STAPLE) ×3 IMPLANT
STEM FEM SZ3 STD ACTIS (Stem) ×3 IMPLANT
SUT ETHIBOND NAB CT1 #1 30IN (SUTURE) ×9 IMPLANT
SUT MNCRL AB 3-0 PS2 27 (SUTURE) ×3 IMPLANT
SUT VIC AB 0 CT1 27 (SUTURE) ×4
SUT VIC AB 0 CT1 27XBRD ANBCTR (SUTURE) ×2 IMPLANT
SUT VIC AB 1 CT1 27 (SUTURE) ×10
SUT VIC AB 1 CT1 27XBRD ANBCTR (SUTURE) ×5 IMPLANT
SUT VIC AB 2-0 CT1 27 (SUTURE) ×8
SUT VIC AB 2-0 CT1 TAPERPNT 27 (SUTURE) ×4 IMPLANT
TOWEL GREEN STERILE (TOWEL DISPOSABLE) ×3 IMPLANT
TOWEL GREEN STERILE FF (TOWEL DISPOSABLE) ×3 IMPLANT
TOWER CARTRIDGE SMART MIX (DISPOSABLE) IMPLANT
WATER STERILE IRR 1000ML POUR (IV SOLUTION) ×6 IMPLANT

## 2020-03-22 NOTE — Op Note (Signed)
NAMETRISTY, UDOVICH MEDICAL RECORD JO:[id number] ACCOUNT [id number] DATE OF BIRTH:[birth year] FACILITY: [facility] LOCATION: [facility] PHYSICIAN:Shakeema Lippman Randel Pigg, MD  OPERATIVE REPORT  DATE OF PROCEDURE:  03/22/2020  PREOPERATIVE DIAGNOSIS:  Left hip femoral neck fracture.  POSTOPERATIVE DIAGNOSIS:  Left hip femoral neck fracture.  PROCEDURE:  Left hip hemiarthroplasty using DePuy Actis stem size 3 with standard neck +1.5 mm ball and 45 bipolar cup.  SURGEON:  Meredith Pel, MD  ASSISTANT:  Annie Main, PA  INDICATIONS:  The patient is a 86 year old patient with left hip fracture, presents for operative management after explanation of risks and benefits.  PROCEDURE IN DETAIL:  The patient was brought to the operating room where general endotracheal anesthesia was induced.  Preoperative antibiotics administered.  Timeout was called.  The patient was placed on the Hana bed.  Left hip was then pre-scrubbed  with alcohol and Betadine, allowed to air dry, prepped with DuraPrep solution and draped in a sterile manner.  Ioban used to cover the operative field.  Timeout was called.  Anterior approach to the hip was made.  An incision about 9 cm made 2 cm  inferior and distal to the anterior superior iliac crest.  Skin and subcutaneous tissue were sharply divided.  Fascia overlying the tensor fascia lata was divided and the plane between the tensor fascia lata and the rectus was developed.  Crossing  vessels were coagulated.  The acetabular retractors were placed on the superior and inferior aspect of the femoral neck.  Capsulotomy was made and tagged with a #1 Vicryl suture.  Femoral neck cut was then revised with an oscillating saw.  The head was  then removed.  The socket sized to 45 with excellent press fit obtained.  Pulvinar was removed.  At this time, femoral lift was placed and the leg was placed in extension and external rotation.  The canal was then broached up to a size 3.  Size 3 was   then placed and checked under fluoroscopy.  A +1.5 head and 45 mm bipolar cup was placed and the patient had very good stability with 50 degrees of extension and 45 degrees of external rotation.  The trial components were removed.  Thorough irrigation  was performed.  True components placed with excellent press fit obtained and confirmation on radiographs of good placement.  Same stability parameters were maintained.  Leg lengths equal.  Thorough irrigation again performed.  Vancomycin powder placed  and then capsule was closed using #1 Vicryl suture, followed by irrigation and vancomycin and reapproximation of the tensor fascia lata fascia using #1 Vicryl suture, followed by irrigation,  vancomycin powder and closed using 0 Vicryl suture, 2-0 Vicryl  suture and a 3-0 Monocryl.  Steri-Strips and Aquacel dressing applied.  The patient tolerated the procedure well without immediate complications, transferred to the recovery room in stable condition.  Luke's assistance was required at all times for  retraction, opening and closing, mobilization of tissues.  His assistance was a medical necessity.  VN/NUANCE  D:03/22/2020 T:03/22/2020 JOB:[id number]

## 2020-03-22 NOTE — Progress Notes (Signed)
Patient has mental status changes this morning.  Is not particularly responsive or verbal.  Although she has baseline dementia this is a change compared to yesterday.  Plan for evaluation by the medical service prior to hip fracture surgery

## 2020-03-22 NOTE — Progress Notes (Signed)
CT scan negative for acute bleed We will proceed with hip hemiarthroplasty

## 2020-03-22 NOTE — Progress Notes (Signed)
Date: 03/22/2020  Patient name: Melissa Noble  Medical record number: [id number]  Date of birth: 1923/10/22   I have seen and evaluated Melissa Noble and discussed their care with the Residency Team.  In brief, patient is a 86 year old female with past medical history of CAD, TIA, right carotid artery stenosis, CKD stage IV, hyperlipidemia and hypertension who presented to the ED after a fall.  History obtained from chart as patient is unable to provide history at this time.  Per chart, patient was getting to bed when she noted her husband was trying to fall and she attempted to catch him and fell herself.  Patient landed on her left hip and hit her head as well.  She did not have any loss of consciousness.  Patient went back to bed after the incident but after waking up noted persistent left-sided hip pain.  EMS was called and patient was brought to the ED for further evaluation.  No chest pain, no palpitations, no diaphoresis, no syncope, no focal weakness, no tingling or numbness, no nausea or vomiting, no abdominal pain, no diarrhea, no fevers or chills.  Today, we were called to evaluate the patient secondary to an acute change in her mental status.  Patient was noted to be unresponsive preop.  She also had decreased respiratory rate down to 8 as well as mild hypoxia in the 80s.  Patient was able to open her eyes but did not respond to questions and only flailed her arms on attempted sternal rub.  Patient was given 2 doses of Narcan 0.4 with improvement in respirations but she remained unresponsive.  Rapid response called at that time and patient was transferred to radiology for a stat CT in the setting of recent fall and acute change in mental status.  This was concerning for possible stroke and code stroke was activated.  Neurology (Dr. Cheral Marker) came by to evaluate the patient in radiology.  We discussed the patient with Dr. Cheral Marker at bedside.  At this time, patient was more awake and was  able to answer questions and follow commands.  She was transferred back and was able to undergo her left hip arthroplasty today.   PMHx, Fam Hx, and/or Soc Hx : As per resident admit note  Vitals:   03/22/20 1255 03/22/20 1326  BP: (!) 158/64 (!) 152/69  Pulse: 74 73  Resp: 12 16  Temp: 97.7 F (36.5 C) 97.6 F (36.4 C)  SpO2: 100% 100%   General: Unresponsive when I initially examined her except for flailing her arms on attempted sternal rub.  CVS: Regular rate and rhythm, systolic murmur noted Lungs: Decreased respiratory rate, CTA bilaterally Abdomen: Soft, nontender, nondistended, normoactive bowel sounds Extremities: No edema noted, left lower extremity shortened and externally rotated HEENT: Normocephalic, atraumatic Skin: Warm and dry Neuro: Patient was initially unresponsive to name and flailed her arms on attempted sternal rub.  She did not follow commands.  She was unable to track with her eyes even though she did open her eyes intermittently.  This exam improved by the time neurology came by to see her and she was able to answer some questions and follow commands  Assessment and Plan: I have seen and evaluated the patient as outlined above. I agree with the formulated Assessment and Plan as detailed in the residents' note, with the following changes:   1.  Altered mental status: -Today, the internal medicine team was called to evaluate the patient for an acute episode of unresponsiveness.  Patient  was oriented x3 and able to answer questions yesterday but this morning in the preop area she was noted to be unresponsive except for occasional flailing her arms with painful stimuli. -The etiology behind her altered mental status remains uncertain.  Patient mental status did improve relatively quickly and her AMS may have been secondary to opiate pain medication given for her left hip pain.  Patient did receive Narcan x2 doses with improvement in her respirations but she initially  remained somnolent.  However, approximately 5 to 10 minutes later patient was able to answer questions and follow commands -We will monitor patient closely.  We will reevaluate patient today after her left hip arthroplasty -Case discussed with neurology at bedside.  Stat repeat head CT was done which showed no acute changes -This is unlikely to have been secondary to a CVA.  No further neuro work-up at this time  2.  Left femoral neck fracture: -Patient presented to the ED after a fall and was found to have a left femoral neck fracture.  The etiology given her fall was likely mechanical as she was trying to prevent her husband from falling. -Patient status post left hip hemiarthroplasty today -We will continue with pain control but will need to be judicious with opiate pain medication given her episode of unresponsiveness today -Resume DVT prophylaxis once okay with orthopedics -No further work-up at this time -We will obtain PT/OT evaluation in a.m.  Aldine Contes, MD 10/9/20211:34 PM

## 2020-03-22 NOTE — Progress Notes (Signed)
Pt received in short stay bay 36. Pt unable to answer questions: name, location, date. Pt initially tracking people in room. Arms across chest. Initially shaking head no to any question. RN on 6N called; states pt became increasingly confused and agitated in the early morning. Pt neurostatus diminishing while in SS. Pt becomes extremely sleepy, only responding to painful stimulation. Internal medicine service notified. Dr Fransisco Beau at pt bedside. VSS. HR 97, NSR. BP 179/82, SpO2 95%. Pt placed on 2L [place]. Internal medicine MDs at bedside at approximately 0730. Narcan 61mcg IV x2. No response. Pt ordered to have stat head CT by medicine. Rapid response nurse called to help transport pt to CT. Pt to CT at 0800, full monitors, [place] O2 2L. Rapid response nurse to stay with pt during CT.

## 2020-03-22 NOTE — Anesthesia Procedure Notes (Signed)
Procedure Name: Intubation Date/Time: 03/22/2020 9:50 AM Performed by: Renato Shin, CRNA Pre-anesthesia Checklist: Patient identified, Emergency Drugs available, Suction available and Patient being monitored Patient Re-evaluated:Patient Re-evaluated prior to induction Oxygen Delivery Method: Circle system utilized Preoxygenation: Pre-oxygenation with 100% oxygen Induction Type: IV induction Ventilation: Mask ventilation without difficulty Laryngoscope Size: Miller and 2 Grade View: Grade I Tube type: Oral Tube size: 7.0 mm Number of attempts: 1 Airway Equipment and Method: Stylet and Oral airway Placement Confirmation: ETT inserted through vocal cords under direct vision,  positive ETCO2 and breath sounds checked- equal and bilateral Secured at: 21 cm Tube secured with: Tape Dental Injury: Teeth and Oropharynx as per pre-operative assessment

## 2020-03-22 NOTE — Progress Notes (Addendum)
2unit of prbc were not transfused. Per OR staff,it was ordered just in case.  Pt might need it.

## 2020-03-22 NOTE — Anesthesia Postprocedure Evaluation (Signed)
Anesthesia Post Note  Patient: Melissa Noble  Procedure(s) Performed: ARTHROPLASTY BIPOLAR HIP (HEMIARTHROPLASTY) (Left Hip)     Patient location during evaluation: PACU Anesthesia Type: General Level of consciousness: awake and confused (Baseline) Pain management: pain level controlled Vital Signs Assessment: post-procedure vital signs reviewed and stable Respiratory status: spontaneous breathing, nonlabored ventilation and respiratory function stable Cardiovascular status: blood pressure returned to baseline and stable Postop Assessment: no apparent nausea or vomiting Anesthetic complications: no   No complications documented.  Last Vitals:  Vitals:   03/22/20 1240 03/22/20 1255  BP: (!) 141/70 (!) 158/64  Pulse: 66 74  Resp: 14 12  Temp:  36.5 C  SpO2: 100% 100%    Last Pain:  Vitals:   03/22/20 0136  TempSrc:   PainSc: [place] Danai Gotto

## 2020-03-22 NOTE — Brief Op Note (Signed)
   03/22/2020  12:15 PM  PATIENT:  Melissa Noble  86 y.o. female  PRE-OPERATIVE DIAGNOSIS:  Left hip fx  POST-OPERATIVE DIAGNOSIS:  Left hip fx  PROCEDURE:  Procedure(s): ARTHROPLASTY BIPOLAR HIP (HEMIARTHROPLASTY)  SURGEON:  Surgeon(s): Meredith Pel, MD  ASSISTANT: magnant pa  ANESTHESIA:   general  EBL: 100 ml    Total I/O In: 950 [I.V.:500; IV Piggyback:450] Out: 200 [Blood:200]  BLOOD ADMINISTERED: none  DRAINS: none   LOCAL MEDICATIONS USED:  vanco  SPECIMEN:  No Specimen  COUNTS:  YES  TOURNIQUET:  * No tourniquets in log *  DICTATION: .Other Dictation: Dictation Number [id number]  PLAN OF CARE: Admit to inpatient   PATIENT DISPOSITION:  PACU - hemodynamically stable

## 2020-03-22 NOTE — Progress Notes (Signed)
Received pt from PACU, alert/oriented to self only  Pt in no apparent distress. Surgical site with mepilex CDI.

## 2020-03-22 NOTE — Consult Note (Signed)
Referring Physician: Dr. Heber [place]    Chief Complaint: AMS  HPI: Melissa Noble is an 86 y.o. female with a PMHx of CAD and HTN who presented to [facility] with left hip fracture. Overnight she became agitated. At that time she was evaluated by IM team and she was noted to be nonfocal in the context of her agitation. Dilaudid was given for her agitation. This AM, she was noted to be altered with decreased responsiveness. Code Stroke was called.   LSN: 0300 tPA Given: No: Nonlateralizing exam. Speech pattern not consistent with a lesional aphasia. Presentation most consistent with acute hypoactive delirium.   Past Medical History:  Diagnosis Date  . Coronary artery disease   . Hypertension     No family history on file. Social History:  reports that she has quit smoking. She has never used smokeless tobacco. She reports previous alcohol use. She reports previous drug use.  Allergies:  Allergies  Allergen Reactions  . Ticlopidine Hives, Itching, Swelling and Rash  . Depakote [Divalproex Sodium] Other (See Comments)    Unknown   . Penicillins Rash    Medications:  Scheduled: . acetaminophen  500 mg Oral Q6H  . clopidogrel  75 mg Oral Daily  . docusate sodium  100 mg Oral BID  . losartan  100 mg Oral Daily  . naLOXone (NARCAN)  injection  0.4 mg Intravenous Once  . simvastatin  20 mg Oral QHS   Continuous: . sodium chloride    . ceFAZolin    . lactated ringers    . methocarbamol (ROBAXIN) IV    . vancomycin      ROS: Denies having any symptoms. Unable to obtain detailed ROS due to severe cognitive/communication deficit.   Physical Examination: Blood pressure (!) 154/74, pulse 83, temperature 97.8 F (36.6 C), resp. rate 20, height 5\' 1"  (1.549 m), weight 32.7 kg, SpO2 96 %.  HEENT: [place]/AT Lungs: Respirations unlabored Ext: No edema. Left hip pain noted with movement.   Neurologic Examination: Mental Status: Awake with decreased level of alertness. Keeps eyes closed to  partially opened throughout the exam. Pleasant and calm but abulic. Attempts to cooperate. Terse replies to questions are fluent and non-dysarthric. Longest sentences used are about 4 words long. Not oriented to any of questions asked, including her name, her husband's name, current circumstance or the fact that she is in the hospital.  Cranial Nerves: II:  PERRL. Does not consistently blink to threat. Will glance towards visual stimuli in right and left temporal visual fields.  III,IV, VI: Eyes partially closed, but without definite ptosis. Eyes conjugate. Will glance briefly to the left and right. Not tracking to command.  V,VII: Face is symmetric. Reacts to touch bilaterally.  VIII: Hearing intact to some questions IX,X: Phonation intact XI: Head is midline XII: Does not protrude tongue to command, but wags it back and forth briefly Motor: BUE remain elevated on request after they are passively elevated by examiner, without drift. Does not follow commands for formal strength testing.  RLE: Briefly moves to stimulation but does not participate in formal motor testing.  LLE: When touched, she states that it hurts (has left hip fracture) Sensory: Reacts to touch x 4 Deep Tendon Reflexes:  3+ bilateral brachioradialis 1+ right patellar Deferred left patellar due to pain Plantars: Equivocal bilaterally Cerebellar/Gait: Unable to assess  Results for orders placed or performed during the hospital encounter of 03/21/20 (from the past 48 hour(s))  Comprehensive metabolic panel     Status: Abnormal  Collection Time: 03/21/20  8:20 AM  Result Value Ref Range   Sodium 138 135 - 145 mmol/L   Potassium 4.3 3.5 - 5.1 mmol/L   Chloride 109 98 - 111 mmol/L   CO2 19 (L) 22 - 32 mmol/L   Glucose, Bld 125 (H) 70 - 99 mg/dL    Comment: Glucose reference range applies only to samples taken after fasting for at least 8 hours.   BUN 53 (H) 8 - 23 mg/dL   Creatinine, Ser 1.98 (H) 0.44 - 1.00 mg/dL    Calcium 9.8 8.9 - 10.3 mg/dL   Total Protein 6.1 (L) 6.5 - 8.1 g/dL   Albumin 3.4 (L) 3.5 - 5.0 g/dL   AST 23 15 - 41 U/L   ALT 22 0 - 44 U/L   Alkaline Phosphatase 61 38 - 126 U/L   Total Bilirubin 0.8 0.3 - 1.2 mg/dL   GFR calc non Af Amer 21 (L) >60 mL/min   Anion gap 10 5 - 15    Comment: Performed at [facility] [address] [place], [place] [address]  CBC with Differential     Status: Abnormal   Collection Time: 03/21/20  8:20 AM  Result Value Ref Range   WBC 14.0 (H) 4.0 - 10.5 K/uL   RBC 3.22 (L) 3.87 - 5.11 MIL/uL   Hemoglobin 10.1 (L) 12.0 - 15.0 g/dL   HCT 30.9 (L) 36 - 46 %   MCV 96.0 80.0 - 100.0 fL   MCH 31.4 26.0 - 34.0 pg   MCHC 32.7 30.0 - 36.0 g/dL   RDW 13.1 11.5 - 15.5 %   Platelets 209 150 - 400 K/uL   nRBC 0.0 0.0 - 0.2 %   Neutrophils Relative % 85 %   Neutro Abs 11.9 (H) 1.7 - 7.7 K/uL   Lymphocytes Relative 7 %   Lymphs Abs 0.9 0.7 - 4.0 K/uL   Monocytes Relative 7 %   Monocytes Absolute 1.0 0.1 - 1.0 K/uL   Eosinophils Relative 0 %   Eosinophils Absolute 0.0 0 - 0 K/uL   Basophils Relative 0 %   Basophils Absolute 0.0 0 - 0 K/uL   Immature Granulocytes 1 %   Abs Immature Granulocytes 0.08 (H) 0.00 - 0.07 K/uL    Comment: Performed at [facility] [address], [place], [place] [address]  Protime-INR     Status: None   Collection Time: 03/21/20  8:20 AM  Result Value Ref Range   Prothrombin Time 14.2 11.4 - 15.2 seconds   INR 1.1 0.8 - 1.2    Comment: (NOTE) INR goal varies based on device and disease states. Performed at [facility], [place] [address] [place], [place] [address]   Respiratory Panel by RT PCR (Flu A&B, Covid) - Nasopharyngeal Swab     Status: None   Collection Time: 03/21/20  1:40 PM   Specimen: Nasopharyngeal Swab  Result Value Ref Range   SARS Coronavirus 2 by RT PCR NEGATIVE NEGATIVE    Comment: (NOTE) SARS-CoV-2 target nucleic acids are NOT DETECTED.  The SARS-CoV-2 RNA is generally detectable  in upper respiratoy specimens during the acute phase of infection. The lowest concentration of SARS-CoV-2 viral copies this assay can detect is 131 copies/mL. A negative result does not preclude SARS-Cov-2 infection and should not be used as the sole basis for treatment or other patient management decisions. A negative result may occur with  improper specimen collection/handling, submission of specimen  other than nasopharyngeal swab, presence of viral mutation(s) within the areas targeted by this assay, and inadequate number of viral copies (<131 copies/mL). A negative result must be combined with clinical observations, patient history, and epidemiological information. The expected result is Negative.  Fact Sheet for Patients:  PinkCheek.be  Fact Sheet for Healthcare Providers:  GravelBags.it  This test is no t yet approved or cleared by the [place] FDA and  has been authorized for detection and/or diagnosis of SARS-CoV-2 by FDA under an Emergency Use Authorization (EUA). This EUA will remain  in effect (meaning this test can be used) for the duration of the COVID-19 declaration under Section 564(b)(1) of the Act, 21 U.S.C. section 360bbb-3(b)(1), unless the authorization is terminated or revoked sooner.     Influenza A by PCR NEGATIVE NEGATIVE   Influenza B by PCR NEGATIVE NEGATIVE    Comment: (NOTE) The Xpert Xpress SARS-CoV-2/FLU/RSV assay is intended as an aid in  the diagnosis of influenza from Nasopharyngeal swab specimens and  should not be used as a sole basis for treatment. Nasal washings and  aspirates are unacceptable for Xpert Xpress SARS-CoV-2/FLU/RSV  testing.  Fact Sheet for Patients: PinkCheek.be  Fact Sheet for Healthcare Providers: GravelBags.it  This test is not yet approved or cleared by the [place] FDA and  has been authorized  for detection and/or diagnosis of SARS-CoV-2 by  FDA under an Emergency Use Authorization (EUA). This EUA will remain  in effect (meaning this test can be used) for the duration of the  Covid-19 declaration under Section 564(b)(1) of the Act, 21  U.S.C. section 360bbb-3(b)(1), unless the authorization is  terminated or revoked. Performed at [facility], [place] [address], [place], [place] [address]   MRSA PCR Screening     Status: None   Collection Time: 03/22/20  1:39 AM  Result Value Ref Range   MRSA by PCR NEGATIVE NEGATIVE    Comment:        The GeneXpert MRSA Assay (FDA approved for NASAL specimens only), is one component of a comprehensive MRSA colonization surveillance program. It is not intended to diagnose MRSA infection nor to guide or monitor treatment for MRSA infections. Performed at [facility], [place] [address] [place], [place] [address]    DG Chest 1 View  Result Date: 03/21/2020 CLINICAL DATA:  Pain following fall EXAM: CHEST  1 VIEW COMPARISON:  None. FINDINGS: Lungs are clear. Heart size and pulmonary vascularity are normal. No adenopathy. There is aortic atherosclerosis. There is calcification in each carotid artery with a stent in the left carotid artery region. There is no evident pneumothorax. No fracture. Bones are osteoporotic. IMPRESSION: Lungs clear. Cardiac silhouette normal. No pneumothorax. Aortic atherosclerosis as well as carotid artery calcification bilaterally. Bones osteoporotic. Aortic Atherosclerosis (ICD10-I70.0). Electronically Signed   By: Lowella Grip III M.D.   On: 03/21/2020 08:58   CT Head Wo Contrast  Result Date: 03/21/2020 CLINICAL DATA:  Fall on blood thinners.  Head trauma.  Neck pain. EXAM: CT HEAD WITHOUT CONTRAST CT CERVICAL SPINE WITHOUT CONTRAST TECHNIQUE: Multidetector CT imaging of the head and cervical spine was performed following the standard protocol without intravenous contrast. Multiplanar CT image  reconstructions of the cervical spine were also generated. COMPARISON:  None. FINDINGS: CT HEAD FINDINGS Brain: No evidence of acute infarction, hemorrhage, hydrocephalus, extra-axial collection or mass lesion/mass effect. Right temporal lobe encephalomalacia compatible with remote ischemia. Extensive low-density changes within the periventricular and subcortical white matter compatible with  chronic microvascular ischemic change. Mild diffuse cerebral volume loss. Vascular: Atherosclerotic calcifications involving the large vessels of the skull base. No unexpected hyperdense vessel. Skull: Normal. Negative for fracture or focal lesion. Sinuses/Orbits: No acute finding. Other: None. CT CERVICAL SPINE FINDINGS Alignment: Facet joints are aligned without dislocation or traumatic listhesis. Dens and lateral masses are aligned. Trace retrolisthesis C5 on C6. Skull base and vertebrae: Subtle superior endplate depression of the T2 vertebral body where there is a small superior endplate Schmorl's node. Findings are favored chronic. Elsewhere. No evidence of acute fracture. No suspicious bone lesion. Soft tissues and spinal canal: No prevertebral fluid or swelling. No visible canal hematoma. Disc levels: Advanced degenerative disc disease of C5-6 with prominent endplate spurring and uncovertebral arthropathy. Remaining disc heights are relatively preserved. There is multilevel bilateral advanced facet arthropathy throughout the cervical spine. Upper chest: Mild biapical pleuroparenchymal scarring within the visualized lung apices. Other: Heterogeneous thyroid containing numerous small nodules. Left-sided carotid stent. IMPRESSION: CT head: 1. No CT evidence of acute intracranial process. 2. Right temporal lobe encephalomalacia compatible with remote ischemia. 3. Chronic microvascular ischemic change and cerebral volume loss. CT cervical spine: 1. No evidence of acute fracture or traumatic listhesis of the cervical spine. 2.  Subtle superior endplate depression of the T2 vertebral body where there is a small endplate Schmorl's node. Findings are favored chronic/degenerative. Correlate for point tenderness at this level. 3. Advanced degenerative disc disease of C5-6 and advanced multilevel facet arthropathy. 4. Heterogeneous thyroid containing numerous small nodules. In the setting of significant comorbidities or limited life expectancy, no follow-up recommended (ref: J Am Coll Radiol. 2015 Feb;12(2): 143-50). Electronically Signed   By: Davina Poke D.O.   On: 03/21/2020 09:26   CT Cervical Spine Wo Contrast  Result Date: 03/21/2020 CLINICAL DATA:  Fall on blood thinners.  Head trauma.  Neck pain. EXAM: CT HEAD WITHOUT CONTRAST CT CERVICAL SPINE WITHOUT CONTRAST TECHNIQUE: Multidetector CT imaging of the head and cervical spine was performed following the standard protocol without intravenous contrast. Multiplanar CT image reconstructions of the cervical spine were also generated. COMPARISON:  None. FINDINGS: CT HEAD FINDINGS Brain: No evidence of acute infarction, hemorrhage, hydrocephalus, extra-axial collection or mass lesion/mass effect. Right temporal lobe encephalomalacia compatible with remote ischemia. Extensive low-density changes within the periventricular and subcortical white matter compatible with chronic microvascular ischemic change. Mild diffuse cerebral volume loss. Vascular: Atherosclerotic calcifications involving the large vessels of the skull base. No unexpected hyperdense vessel. Skull: Normal. Negative for fracture or focal lesion. Sinuses/Orbits: No acute finding. Other: None. CT CERVICAL SPINE FINDINGS Alignment: Facet joints are aligned without dislocation or traumatic listhesis. Dens and lateral masses are aligned. Trace retrolisthesis C5 on C6. Skull base and vertebrae: Subtle superior endplate depression of the T2 vertebral body where there is a small superior endplate Schmorl's node. Findings are  favored chronic. Elsewhere. No evidence of acute fracture. No suspicious bone lesion. Soft tissues and spinal canal: No prevertebral fluid or swelling. No visible canal hematoma. Disc levels: Advanced degenerative disc disease of C5-6 with prominent endplate spurring and uncovertebral arthropathy. Remaining disc heights are relatively preserved. There is multilevel bilateral advanced facet arthropathy throughout the cervical spine. Upper chest: Mild biapical pleuroparenchymal scarring within the visualized lung apices. Other: Heterogeneous thyroid containing numerous small nodules. Left-sided carotid stent. IMPRESSION: CT head: 1. No CT evidence of acute intracranial process. 2. Right temporal lobe encephalomalacia compatible with remote ischemia. 3. Chronic microvascular ischemic change and cerebral volume loss. CT cervical spine: 1.  No evidence of acute fracture or traumatic listhesis of the cervical spine. 2. Subtle superior endplate depression of the T2 vertebral body where there is a small endplate Schmorl's node. Findings are favored chronic/degenerative. Correlate for point tenderness at this level. 3. Advanced degenerative disc disease of C5-6 and advanced multilevel facet arthropathy. 4. Heterogeneous thyroid containing numerous small nodules. In the setting of significant comorbidities or limited life expectancy, no follow-up recommended (ref: J Am Coll Radiol. 2015 Feb;12(2): 143-50). Electronically Signed   By: Davina Poke D.O.   On: 03/21/2020 09:26   DG Hip Unilat With Pelvis 2-3 Views Left  Result Date: 03/21/2020 CLINICAL DATA:  Pain following fall EXAM: DG HIP (WITH OR WITHOUT PELVIS) 2-3V LEFT COMPARISON:  None. FINDINGS: Frontal pelvis as well as frontal and lateral left hip images were obtained. There is a subcapital femoral neck fracture on the left with impaction and mild varus angulation at the fracture site. No other fracture. No dislocation. Bones are osteoporotic. There is mild  symmetric narrowing of each hip joint. No erosion. There are multiple foci of arterial vascular calcification IMPRESSION: Left subcapital femoral neck fracture with mild varus angulation and impaction at the fracture site. No other fracture. No dislocation. Symmetric narrowing of each hip joint. Bones diffusely osteoporotic. Multiple foci of arterial vascular calcification noted. Electronically Signed   By: Lowella Grip III M.D.   On: 03/21/2020 08:57    Assessment: 86 y.o. female presenting with acute onset of hypoactive delirium 1. Initially became agitated overnight. Decreased responsiveness this AM after Dilaudid was administered overnight.  2. Nonlateralizing exam. Speech pattern not consistent with a lesional aphasia. Presentation most consistent with acute hypoactive delirium.  3. No indication for tPA based on overall presentation. Not a thrombectomy candidate as exam findings are not consistent with LVO.  4. CT head: No acute finding by CT. Atrophy and extensive chronic small-vessel ischemic changes. 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 ASPECTS is 10. 5. Stroke Risk Factors - CAD and HTN  Recommendations: 1. Limit sedating medications 2. Management of left hip fracture per primary team.  3. Frequent neuro checks   @Electronically  signed: Dr. Kerney Elbe 03/22/2020, 8:20 AM

## 2020-03-22 NOTE — Transfer of Care (Signed)
Immediate Anesthesia Transfer of Care Note  Patient: Melissa Noble  Procedure(s) Performed: ARTHROPLASTY BIPOLAR HIP (HEMIARTHROPLASTY) (Left Hip)  Patient Location: PACU  Anesthesia Type:General  Level of Consciousness: drowsy and patient cooperative  Airway & Oxygen Therapy: Patient Spontanous Breathing and Patient connected to face mask oxygen  Post-op Assessment: Report given to RN and Post -op Vital signs reviewed and stable  Post vital signs: Reviewed and stable  Last Vitals:  Vitals Value Taken Time  BP 159/73 03/22/20 1225  Temp 36.2 C 03/22/20 1225  Pulse 69 03/22/20 1229  Resp 11 03/22/20 1229  SpO2 100 % 03/22/20 1229  Vitals shown include unvalidated device data.  Last Pain:  Vitals:   03/22/20 0136  TempSrc:   PainSc: Asleep      Patients Stated Pain Goal: 0 (04/13/58 4585)  Complications: No complications documented.

## 2020-03-22 NOTE — Progress Notes (Addendum)
HD#1 Subjective:  Overnight Events: Night team paged for severe pain despite .5 mg at 0100. Additional dose of .5 dilaudid given at 0345. Vital signs were unremarkable at this time and patient was breathing normally per nursing staff.   The internal medicine team was paged to the pre-operative room at approximately 0730 this am because the patient had an acute change in mental status and was no longer responding to verbal stimuli. On bedside in pre-op, the patient was unresponsive with decreased respirations and hypertensive. Nursing staff are unsure when the acute change onset. Patient did appear to have pinpoint pupils with decreased respirations upon examination. No focal deficit was noted. Patient with reflexive flexion of the upper extremities upon sternal rub. At this time, it was thought the patient had lingering opioids in her system, due to her history of CKD, causing increasing amounts of opioids causing respiratory depression and decreased mentation. Also in the differential was an acute hemorrhagic stroke  An acute hemorrhagic stroke was suspected as when the patient initially fell, prior to arrival to the ED yesterday, she hit her head. The patient did not lose consciousness. She is on chronic plavix so a CT without contrast was performed in the ED which was negative, for any acute intracranial process'.  However, with the acute change in mental status that had occurred since the patient's admission, a stroke could not be ruled out without further imaging. It was undetermined the last time the patient was at her admission status of alert and oriented x 3, but she was examined by myself at approximately 2025 yesterday evening and was at her admission status.  The initial thought was that the patient's change in mental status was due to the opioids because of the acute change in mentation and respiratory depression. The patient received .4 mg of narcan IV. There was no response after  approximately 5-10 minute so an additional .4 mg was administered. The patient's respirations did improve with this, however, the patient remained unresponsive to verbal stimuli and because of this, a suspected code stroke was called by the internal medicine team.   The patient was transported to CT scan for stat imaging by rapid response and neurology was consulted to activate a potential code stroke. Dr. Cheral Marker, neurologist on call, returned my page and I presented the patient to him, informing him of the patient's admission and hospital course thus far.   I was unable to inform Dr. Cheral Marker the exact time the patient was at her baseline status nor did I know the patient's modified rankin scale for neurological disability. As such, it was difficult to determine the acuteness of this change and how to proceed.   Dr. Cheral Marker was met at the CT scanner and examined the patient at bedside with the internal medicine team at bedside. No focal neurological deficit was found and the patient was was responding to verbal stimuli, an improvement from our initial encounter this morning. At this time, our suspicion was lowered that the patient was having an acute hemorrhagic stroke. Her head CT without contrast was negative for an acut ebleed. The patient was then taken to her orthopedic procedure.   1430  The patient was in her room post-operatively. She was pleasantly demented, only oriented to person. She continued to flex her shoulders and would put them down when instructed. She denied any pain at this time. Nursing staff at bedside.   Objective:  Vital signs in last 24 hours: Vitals:   03/22/20 1225  03/22/20 1240 03/22/20 1255 03/22/20 1326  BP: (!) 159/73 (!) 141/70 (!) 158/64 (!) 152/69  Pulse: 72 66 74 73  Resp: '12 14 12 16  ' Temp: (!) 97.2 F (36.2 C)  97.7 F (36.5 C) 97.6 F (36.4 C)  TempSrc:    Axillary  SpO2: 100% 100% 100% 100%  Weight:      Height:       Supplemental O2: Nasal  Cannula SpO2: 100 % O2 Flow Rate (L/min): 6 L/min   Physical Exam:  Physical Exam Vitals and nursing note reviewed.  Constitutional:      General: She is not in acute distress.    Appearance: Normal appearance. She is not ill-appearing, toxic-appearing or diaphoretic.     Comments: pleasant  Cardiovascular:     Rate and Rhythm: Normal rate and regular rhythm.     Heart sounds: Murmur (2/6 systolic murmur) heard.   Pulmonary:     Effort: Pulmonary effort is normal. No respiratory distress.  Skin:    General: Skin is warm and dry.  Neurological:     General: No focal deficit present.     Mental Status: She is alert.     Comments: Alert and oriented to person only, not place or time.      Filed Weights   03/21/20 0759  Weight: 32.7 kg     Intake/Output Summary (Last 24 hours) at 03/22/2020 1422 Last data filed at 03/22/2020 1330 Gross per 24 hour  Intake 950 ml  Output 200 ml  Net 750 ml   Net IO Since Admission: 750 mL [03/22/20 1422]  Pertinent Labs: CBC Latest Ref Rng & Units 03/21/2020  WBC 4.0 - 10.5 K/uL 14.0(H)  Hemoglobin 12.0 - 15.0 g/dL 10.1(L)  Hematocrit 36 - 46 % 30.9(L)  Platelets 150 - 400 K/uL 209    CMP Latest Ref Rng & Units 03/21/2020  Glucose 70 - 99 mg/dL 125(H)  BUN 8 - 23 mg/dL 53(H)  Creatinine 0.44 - 1.00 mg/dL 1.98(H)  Sodium 135 - 145 mmol/L 138  Potassium 3.5 - 5.1 mmol/L 4.3  Chloride 98 - 111 mmol/L 109  CO2 22 - 32 mmol/L 19(L)  Calcium 8.9 - 10.3 mg/dL 9.8  Total Protein 6.5 - 8.1 g/dL 6.1(L)  Total Bilirubin 0.3 - 1.2 mg/dL 0.8  Alkaline Phos 38 - 126 U/L 61  AST 15 - 41 U/L 23  ALT 0 - 44 U/L 22    Imaging: No results found.  Assessment/Plan:   Active Problems:   Hip fracture [facility])   Patient Summary: Ms Camron Essman is a 86 y/o F with a PMHx of coronary artery disease, transient ischemic attack, right carotid artery stenosis, Stage 4 Kidney Disease, hyperlipidemia, and hypertension with a mechanical fall and  left hip pain. Upon presentation to the ED for consistent left hip pain, she was found to have a left subcapital femoral neck fracture. Patient evaluated by orthopedics and taken to the OR today for repair of the left femoral neck fracture. Post-operatively patient denies any pain and is alert and oriented to only person. Will continue to monitor post operatively.   Left Subcapital Femoral Neck Fracture Secondary to Mechanical Fall Patient with mechanical fall after attempting to prevent her husband from falling and injuring her left hip. Patient denies loss of consciousness. X-ray imaging revealed left subcapital femoral neck fracture. Low suspicion fall was due to syncopal episode, neurological or cardiac event. Patient evaluated by orthopedics who will take patient to OR tomorrow. Patient to  be cleared by medical team per orthopedics. Patient with 2 points on revised cardiac risk index, class III risk. 10.1% 30 day risk of death, MI, or cardiac arrest. Patient on statin therapy as well as plavix.   Patient taken to operating room today for repair, hip hemiarthroplasty was performed by Dr. [name] of orthopedic surgery. Total blood loss of 100 mL recorded. No complications occurred during the procedure. The patient is alert and oriented to only person, suspect change is due to anesthetics administered during the surgical procedure will continue to monitor status post operatively.   - Post-op day 0/1 for left hip hemiarthroplasty - Will order heart healthy diet. - SCD's ordered. - Pain control with 500 mg acetaminophen q6h for mild pain, norco 1 tab q4h PRN for severe pain - Orthopedics will continue to follow, appreciate their recommendations at this time.   Transient Altered Mental Status Patient with episode of transient altered mental status early this morning that was noticed after patient was transferred to pre-op. My last interaction with the patient when she was at her admission baseline was  at approximately 2025 yesterday evening, the patient was alert and oriented x 3.She was able to state who she was, where she was, and what year it was.  Overnight the patient was in continuous pain after initial administration of .5 mg dilaudid at approximately 0100 and an additional dose was given at 0345. Because of the patient's CKD, suspect increased amounts of pain medication was lingering in the patient's system due to the decreased renal excretion. Patient's mentation as well as respirations improved with .4 of narcan x 2. Head CT negative for acute stroke.  Appreciate Dr. Yvetta Coder assistance in the care of this patient as well as his recommendations throughout this episode of altered mental status. Will determine modified rankin scale for neurological deficit once patient is at her baseline status. Patient uses walker for assistance ambulating and lives at home with her husband.   Patient alert and oriented to only person post operatively. Suspect this is due to anesthetics from surgical procedure. Will continue to monitor for improvement or worsening mental status.   History of Chronic Kidney Disease Stage 4 Per care everywhere review, patient with history of CKD stage 4, baseline Cr of 1.65-1.96 over the last year. Etiology of ischemic nephropathy and HTN. Upon admission creatinine of 1.98.   Daily BMP's and will continue LR IV fluids at this time post operatively, will adjust if signs/symptoms of volume overload status.   Normocytic Anemia Patient with hemoglobin of 10.1 and MCV of 96. Do not suspect active bleeding. Patient with past medical history of anemia due to chronic kidney disease. Will continue to monitor and transfuse as needed. CBC currently pending, nursing staff having difficult time finding access at this time for blood draw.   - Daily CBC's - Consider transfusion for transfusion if hemoglobin below 7.    Hypertension Blood pressure of 152/69. Med rec states patient  is taking HCTZ and losartan, however, upon chart review, last PCP note from 11/2019 states discontinue HCTZ. Will continue losartan only in light of PCP note and patient's chronic kidney disease  - Continue to monitor blood pressure - Continue home losartan 100 mg daily  Hyperlipidemia Per chart review LDL level of 138 on 09/2019. Will continue home medication of simvastatin 20 mg daily   - Continue simvastatin 20 mg daily  Urge Incontinence Patient on oxybutynin 10 mg 24 hr tab, will continue to hold due to recent episode  of altered mental status. Strict I/o's and will monitor urine output. Will bladder scan as needed.  History of CAD Patient with CAD s/p stent in 1995. Will continue nitroglycerin PRN post operatively.   History of TIA Patient with history of TIA, on 75 mg plavix at home. Resuming post-operatively  Systolic Murmur 2/6 systolic murmur on examination, consistent with aortic stenosis on echo from 2020. Will continue to monitor.  Diet: Heart Healthy IVF: LR,75cc/hr VTE: SCDs Code: Full PT/OT recs: Pending  Dispo: Anticipated discharge to Skilled nursing facility in 3+ days pending PT/OT evaluation and recommendations as well as pain control and SNF placement.   Sanjuana Letters DO Internal Medicine Resident PGY-1  Pager [phone] Please contact the on call pager after 5 pm and on weekends at [phone].

## 2020-03-23 LAB — BASIC METABOLIC PANEL
Anion gap: 10 (ref 5–15)
BUN: 46 mg/dL — ABNORMAL HIGH (ref 8–23)
CO2: 19 mmol/L — ABNORMAL LOW (ref 22–32)
Calcium: 9.4 mg/dL (ref 8.9–10.3)
Chloride: 107 mmol/L (ref 98–111)
Creatinine, Ser: 1.72 mg/dL — ABNORMAL HIGH (ref 0.44–1.00)
GFR, Estimated: 25 mL/min — ABNORMAL LOW (ref >60)
Glucose, Bld: 121 mg/dL — ABNORMAL HIGH (ref 70–99)
Potassium: 4 mmol/L (ref 3.5–5.1)
Sodium: 136 mmol/L (ref 135–145)

## 2020-03-23 LAB — CBC
HCT: 27.1 % — ABNORMAL LOW (ref 36.0–46.0)
Hemoglobin: 9.2 g/dL — ABNORMAL LOW (ref 12.0–15.0)
MCH: 31.8 pg (ref 26.0–34.0)
MCHC: 33.9 g/dL (ref 30.0–36.0)
MCV: 93.8 fL (ref 80.0–100.0)
Platelets: 197 10*3/uL (ref 150–400)
RBC: 2.89 MIL/uL — ABNORMAL LOW (ref 3.87–5.11)
RDW: 13.2 % (ref 11.5–15.5)
WBC: 17.9 10*3/uL — ABNORMAL HIGH (ref 4.0–10.5)
nRBC: 0 % (ref 0.0–0.2)

## 2020-03-23 NOTE — Progress Notes (Signed)
HD#2 Subjective:  Overnight Events: None  Melissa Noble was seen and evaluated at bedside. She is doing well this AM. States that she is not feeling any pain at the surgical site, but admits that she has not tried to ambulate. She needed help reaching out to her son and she was provided a telephone number from the demographics tab. All questions and concerns were addressed at this time.   Objective:  Vital signs in last 24 hours: Vitals:   03/22/20 1326 03/22/20 2121 03/23/20 0225 03/23/20 0515  BP: (!) 152/69 (!) 147/62 (!) 160/72 (!) 180/80  Pulse: 73 (!) 104 96 (!) 109  Resp: 16 15 16 15   Temp: 97.6 F (36.4 C) 97.7 F (36.5 C) 98.1 F (36.7 C) 98 F (36.7 C)  TempSrc: Axillary Oral  Oral  SpO2: 100% 97% 99% 99%  Weight:      Height:       Supplemental O2: Nasal Cannula SpO2: 99 % O2 Flow Rate (L/min): 6 L/min   Physical Exam:  Physical Exam Constitutional:      Appearance: Normal appearance.  HENT:     Head: Normocephalic and atraumatic.  Cardiovascular:     Rate and Rhythm: Normal rate and regular rhythm.     Pulses: Normal pulses.     Heart sounds: Normal heart sounds. No murmur heard.  No friction rub. No gallop.   Pulmonary:     Effort: Pulmonary effort is normal.     Breath sounds: Normal breath sounds. No wheezing, rhonchi or rales.  Abdominal:     General: Abdomen is flat.     Tenderness: There is no abdominal tenderness. There is no guarding.  Skin:    General: Skin is warm.     Comments: L Hip well dressed, no purulence, drainage, or erythema noted.   Neurological:     Mental Status: She is alert.     Comments: Oriented to self and place, but not time.     Filed Weights   03/21/20 0759  Weight: 32.7 kg     Intake/Output Summary (Last 24 hours) at 03/23/2020 0723 Last data filed at 03/23/2020 1749 Gross per 24 hour  Intake 969.07 ml  Output 450 ml  Net 519.07 ml   Net IO Since Admission: 519.07 mL [03/23/20 0723]  Pertinent  Labs: CBC Latest Ref Rng & Units 03/23/2020 03/21/2020  WBC 4.0 - 10.5 K/uL 17.9(H) 14.0(H)  Hemoglobin 12.0 - 15.0 g/dL 9.2(L) 10.1(L)  Hematocrit 36 - 46 % 27.1(L) 30.9(L)  Platelets 150 - 400 K/uL 197 209    CMP Latest Ref Rng & Units 03/23/2020 03/21/2020  Glucose 70 - 99 mg/dL 121(H) 125(H)  BUN 8 - 23 mg/dL 46(H) 53(H)  Creatinine 0.44 - 1.00 mg/dL 1.72(H) 1.98(H)  Sodium 135 - 145 mmol/L 136 138  Potassium 3.5 - 5.1 mmol/L 4.0 4.3  Chloride 98 - 111 mmol/L 107 109  CO2 22 - 32 mmol/L 19(L) 19(L)  Calcium 8.9 - 10.3 mg/dL 9.4 9.8  Total Protein 6.5 - 8.1 g/dL - 6.1(L)  Total Bilirubin 0.3 - 1.2 mg/dL - 0.8  Alkaline Phos 38 - 126 U/L - 61  AST 15 - 41 U/L - 23  ALT 0 - 44 U/L - 22    Imaging: DG C-Arm 1-60 Min  Result Date: 03/22/2020 CLINICAL DATA:  Left hip fracture EXAM: OPERATIVE LEFT HIP (WITH PELVIS IF PERFORMED) 2 VIEWS TECHNIQUE: Fluoroscopic spot image(s) were submitted for interpretation post-operatively. FLUOROSCOPY TIME:  17 seconds  COMPARISON:  Left hip radiographs-03/21/2020 FINDINGS: 2 spot intraoperative fluoroscopic images of the left hip and lower pelvis are provided for review. Images demonstrate the sequela of left bipolar hip replacement. Alignment appears anatomic given AP projection. There is a minimal amount of subcutaneous emphysema scattered about the operative site. A radiopaque clamp overlies the contralateral right hemipelvis, likely external to the patient. No definite radiopaque foreign body Limited visualization of the pelvis demonstrates several phleboliths overlying the lower pelvis bilaterally. Scattered adjacent vascular calcifications. 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 Electronically Signed   By: Sandi Mariscal M.D.   On: 03/22/2020 14:07   DG HIP OPERATIVE UNILAT W OR W/O PELVIS LEFT  Result Date: 03/22/2020 CLINICAL DATA:  Left hip fracture EXAM: OPERATIVE LEFT HIP (WITH PELVIS IF PERFORMED) 2 VIEWS  TECHNIQUE: Fluoroscopic spot image(s) were submitted for interpretation post-operatively. FLUOROSCOPY TIME:  17 seconds COMPARISON:  Left hip radiographs-03/21/2020 FINDINGS: 2 spot intraoperative fluoroscopic images of the left hip and lower pelvis are provided for review. Images demonstrate the sequela of left bipolar hip replacement. Alignment appears anatomic given AP projection. There is a minimal amount of subcutaneous emphysema scattered about the operative site. A radiopaque clamp overlies the contralateral right hemipelvis, likely external to the patient. No definite radiopaque foreign body Limited visualization of the pelvis demonstrates several phleboliths overlying the lower pelvis bilaterally. Scattered adjacent vascular calcifications. 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 Electronically Signed   By: Sandi Mariscal M.D.   On: 03/22/2020 14:07   CT HEAD CODE STROKE WO CONTRAST  Result Date: 03/22/2020 CLINICAL DATA:  Code stroke.  Aphasia.  Mental status changes. EXAM: CT HEAD WITHOUT CONTRAST TECHNIQUE: Contiguous axial images were obtained from the base of the skull through the vertex without intravenous contrast. COMPARISON:  03/21/2020 FINDINGS: Brain: Generalized atrophy. Extensive chronic small-vessel ischemic changes throughout the cerebral hemispheric white matter. 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 No CT evidence of acute infarction, mass lesion, hemorrhage, hydrocephalus or extra-axial collection. Vascular: No acute vascular finding. Skull: Negative Sinuses/Orbits: No significant sinus inflammatory disease. Orbits negative. Other: None ASPECTS ([place] Stroke Program Early CT Score) - Ganglionic level infarction (caudate, lentiform nuclei, internal capsule, insula, M1-M3 cortex): 7 - Supraganglionic infarction (M4-M6 cortex): 3 Total score (0-10 with 10 being normal): 10 IMPRESSION: 1. No acute finding by CT. Atrophy and extensive  chronic small-vessel ischemic changes. 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 2. ASPECTS is 10. 3. These results were communicated to Dr. Cheral Marker at [facility] 10/9/2021by text page via the [facility] messaging system. Electronically Signed   By: Nelson Chimes M.D.   On: 03/22/2020 08:16    Assessment/Plan:   Active Problems:   Hip fracture [facility])   Patient Summary: Melissa Noble is a 86 y/o F with a PMHx of coronary artery disease, transient ischemic attack, right carotid artery stenosis, Stage 4 Kidney Disease, hyperlipidemia, and hypertension with a mechanical fall and left hip pain. Upon presentation to the ED for consistent left hip pain, she was found to have a left subcapital femoral neck fracture. Patient evaluated by orthopedics and taken to the OR today for repair of the left femoral neck fracture. Post-operatively patient denies any pain and is alert and oriented to only person. Will continue to monitor post operatively.   Left Subcapital Femoral Neck Fracture Secondary to Mechanical Fall Patient with mechanical fall after attempting to prevent her husband from falling and injuring her left hip. Patient denies  loss of consciousness. X-ray imaging revealed left subcapital femoral neck fracture. Low suspicion fall was due to syncopal episode, neurological or cardiac event. Patient evaluated by orthopedics who will take patient to OR tomorrow. Patient to be cleared by medical team per orthopedics. Patient with 2 points on revised cardiac risk index, class III risk. 10.1% 30 day risk of death, MI, or cardiac arrest. Patient on statin therapy as well as plavix.   Patient taken to operating room today for repair, hip hemiarthroplasty was performed by Dr. [name] of orthopedic surgery. Total blood loss of 100 mL recorded. No complications occurred during the procedure. The patient is alert and oriented to only person, suspect change is due to anesthetics administered during the  surgical procedure will continue to monitor status post operatively.   - Post-op day 2 for left hip hemiarthroplasty - PT Eval and treat - Back on Plavix - Pain control with 500 mg acetaminophen q6h for mild pain, norco 1 tab q4h PRN for severe pain - Orthopedics will continue to follow, appreciate their recommendations at this time.   History of Chronic Kidney Disease Stage 4 Per care everywhere review, patient with history of CKD stage 4, baseline Cr of 1.65-1.96 over the last year. Etiology of ischemic nephropathy and HTN. Upon admission creatinine of 1.98.  - Cr improved to 1.72 from 1.98 yesterday - Trend BMP  Normocytic Anemia Patient with hemoglobin of 10.1 and MCV of 96. Do not suspect active bleeding. Patient with past medical history of anemia due to chronic kidney disease. Will continue to monitor and transfuse as needed. CBC currently pending, nursing staff having difficult time finding access at this time for blood draw.  - Hgb 9.2 from 10.1 yesterday - Trend CBC - Consider transfusion for transfusion if hemoglobin below 7.    Hypertension Blood pressure of 152/69. Med rec states patient is taking HCTZ and losartan, however, upon chart review, last PCP note from 11/2019 states discontinue HCTZ. Will continue losartan only in light of PCP note and patient's chronic kidney disease. - Continue to monitor blood pressure - Continue home losartan 100 mg daily  Hyperlipidemia Per chart review LDL level of 138 on 09/2019. Will continue home medication of simvastatin 20 mg daily - Continue simvastatin 20 mg daily  Urge Incontinence Patient on oxybutynin 10 mg 24 hr tab, will continue to hold due to recent episode of altered mental status. Strict I/o's and will monitor urine output. Will bladder scan as needed.  History of CAD Patient with CAD s/p stent in 1995. Will continue nitroglycerin PRN post operatively.   History of TIA Patient with history of TIA - Back on home  75 mg plavix  Systolic Murmur 2/6 systolic murmur on examination, consistent with aortic stenosis on echo from 2020. Will continue to monitor. Transient Altered Mental Status Resolved  Diet: Heart Healthy IVF: LR,75cc/hr VTE: SCDs Code: Full PT/OT recs: Pending  Dispo: Anticipated discharge to Skilled nursing facility in 3+ days pending PT/OT evaluation and recommendations as well as pain control and SNF placement.   Sanjuana Letters DO Internal Medicine Resident PGY-1  Pager [phone] Please contact the on call pager after 5 pm and on weekends at [phone].

## 2020-03-23 NOTE — Progress Notes (Signed)
  Subjective: Pt    Objective: Vital signs in last 24 hours: Temp:  [97.2 F (36.2 C)-98.4 F (36.9 C)] 98.4 F (36.9 C) (10/10 0831) Pulse Rate:  [66-109] 77 (10/10 0831) Resp:  [12-16] 15 (10/10 0831) BP: (133-180)/(62-80) 133/79 (10/10 0831) SpO2:  [97 %-100 %] 98 % (10/10 0831)  Intake/Output from previous day: 10/09 0701 - 10/10 0700 In: 969.1 [I.V.:519.1; IV Piggyback:450] Out: 450 [Urine:250; Blood:200] Intake/Output this shift: No intake/output data recorded.  Exam:  Sensation intact distally Dorsiflexion/Plantar flexion intact  Labs: Recent Labs    03/21/20 0820 03/23/20 0446  HGB 10.1* 9.2*   Recent Labs    03/21/20 0820 03/23/20 0446  WBC 14.0* 17.9*  RBC 3.22* 2.89*  HCT 30.9* 27.1*  PLT 209 197   Recent Labs    03/21/20 0820 03/23/20 0446  NA 138 136  K 4.3 4.0  CL 109 107  CO2 19* 19*  BUN 53* 46*  CREATININE 1.98* 1.72*  GLUCOSE 125* 121*  CALCIUM 9.8 9.4   Recent Labs    03/21/20 0820  INR 1.1    Assessment/Plan: Plan for mobilization with PT More alert this am wbat lle Back on plavix at this time   [employer] 03/23/2020, 8:50 AM

## 2020-03-23 NOTE — Evaluation (Signed)
Physical Therapy Evaluation Patient Details Name: Melissa Noble MRN: [id number] DOB: 03-07-1924 Today's Date: 03/23/2020   History of Present Illness  Ms. Delucia is a 86 y/o F with a relevant past medical history of coronary artery disease, transient ischemic attack, right carotid artery stenosis, Stage 4 Kidney Disease, hyperlipidemia, and hypertension who presented to the ED from her retirement community after falling and having left hip pain. Yesterday the patient states she was getting into bed when noticed her husband start to fall, she attempted to catch him and fell herself. She states she landed on her left hip and hit her head. Went to bed; Called EMS in the morning, when her hip ws still quite painful; L hip fracture, now s/p L hip hemiarthroplasty, anterior approach, WBAT  Clinical Impression   Patient is s/p above surgery resulting in functional limitations due to the deficits listed below (see PT Problem List). Comes from home where she lives at [facility]; Independent at baseline (unsure if she typically uses a cane or rW; Presents to PT with decr functional mobility, pain and soreness LLE, decr activity tolerance; Agree with SNF for post-acute rehab at dc from hospital to maximize independence and safety with mobility;   Patient will benefit from skilled PT to increase their independence and safety with mobility to allow discharge to the venue listed below.       Follow Up Recommendations SNF    Equipment Recommendations  Rolling walker with 5" wheels;3in1 (PT)    Recommendations for Other Services       Precautions / Restrictions Precautions Precautions: Fall Restrictions Weight Bearing Restrictions: Yes LLE Weight Bearing: Weight bearing as tolerated      Mobility  Bed Mobility Overal bed mobility: Needs Assistance Bed Mobility: Supine to Sit     Supine to sit: Min assist     General bed mobility comments: Cues for  technqiue, and min assist to help LLE off of the bed and handheld assist to pull to sit  Transfers Overall transfer level: Needs assistance Equipment used: Rolling walker (2 wheeled) Transfers: Sit to/from Stand Sit to Stand: Mod assist         General transfer comment: Light mod assist to power up and steady; tending to posteriorly lean  Ambulation/Gait Ambulation/Gait assistance: Min assist;Min guard Gait Distance (Feet): 14 Feet Assistive device: Rolling walker (2 wheeled) Gait Pattern/deviations: Step-through pattern;Decreased step length - right;Decreased step length - left;Decreased stride length;Trunk flexed Gait velocity: slowed   General Gait Details: Good use of RW for steadiness; cues for posture and to self-monitor for activity tolerance  Stairs            Wheelchair Mobility    Modified Rankin (Stroke Patients Only)       Balance Overall balance assessment: Needs assistance   Sitting balance-Leahy Scale: Fair       Standing balance-Leahy Scale: Poor Standing balance comment: occasional posterior lean                             Pertinent Vitals/Pain Pain Assessment: Faces Faces Pain Scale: Hurts a little bit Pain Location: Soreness L hip Pain Descriptors / Indicators: Aching Pain Intervention(s): Monitored during session    Home Living Family/patient expects to be discharged to:: Skilled nursing facility                 Additional Comments: for post-acute rehab    Prior Function Level of Independence: Independent  with assistive device(s)               Hand Dominance        Extremity/Trunk Assessment   Upper Extremity Assessment Upper Extremity Assessment: Overall WFL for tasks assessed    Lower Extremity Assessment Lower Extremity Assessment: LLE deficits/detail LLE Deficits / Details: Grossly decr AROM and strength, limited by soreness postop       Communication   Communication: No difficulties   Cognition Arousal/Alertness: Awake/alert Behavior During Therapy: WFL for tasks assessed/performed Overall Cognitive Status: Impaired/Different from baseline Area of Impairment: Orientation                 Orientation Level: Disoriented to;Situation             General Comments: Overall interactive and performing well; Occasionally making a comment that isn't entirely oriented to teh situation      General Comments General comments (skin integrity, edema, etc.): Husbans and son present and supportive during session    Exercises     Assessment/Plan    PT Assessment Patient needs continued PT services  PT Problem List Decreased strength;Decreased range of motion;Decreased activity tolerance;Decreased balance;Decreased mobility;Decreased coordination;Decreased cognition;Decreased knowledge of use of DME;Decreased safety awareness;Pain       PT Treatment Interventions DME instruction;Gait training;Functional mobility training;Therapeutic activities;Therapeutic exercise;Balance training;Neuromuscular re-education;Cognitive remediation;Patient/family education    PT Goals (Current goals can be found in the Care Plan section)  Acute Rehab PT Goals Patient Stated Goal: Agreeable to amb PT Goal Formulation: With patient Time For Goal Achievement: 04/06/20 Potential to Achieve Goals: Good    Frequency Min 3X/week   Barriers to discharge        Co-evaluation               AM-PAC PT "6 Clicks" Mobility  Outcome Measure Help needed turning from your back to your side while in a flat bed without using bedrails?: A Little Help needed moving from lying on your back to sitting on the side of a flat bed without using bedrails?: A Little Help needed moving to and from a bed to a chair (including a wheelchair)?: A Little Help needed standing up from a chair using your arms (e.g., wheelchair or bedside chair)?: A Lot Help needed to walk in hospital room?: A Little Help  needed climbing 3-5 steps with a railing? : A Lot 6 Click Score: 16    End of Session Equipment Utilized During Treatment: Gait belt Activity Tolerance: Patient tolerated treatment well Patient left: in chair;with call bell/phone within reach;with chair alarm set Nurse Communication: Mobility status PT Visit Diagnosis: Unsteadiness on feet (R26.81);Other abnormalities of gait and mobility (R26.89);Pain Pain - Right/Left: Left Pain - part of body: Hip    Time: 5498-2641 PT Time Calculation (min) (ACUTE ONLY): 34 min   Charges:   PT Evaluation $PT Eval Low Complexity: 1 Low PT Treatments $Gait Training: 8-22 mins        Roney Marion, PT  Acute Rehabilitation Services Pager [phone] Office [phone]   Colletta [place] 03/23/2020, 5:17 PM

## 2020-03-24 DIAGNOSIS — D649 Anemia, unspecified: Secondary | ICD-10-CM

## 2020-03-24 DIAGNOSIS — S72012A Unspecified intracapsular fracture of left femur, initial encounter for closed fracture: Principal | ICD-10-CM

## 2020-03-24 DIAGNOSIS — N3941 Urge incontinence: Secondary | ICD-10-CM

## 2020-03-24 DIAGNOSIS — R011 Cardiac murmur, unspecified: Secondary | ICD-10-CM

## 2020-03-24 DIAGNOSIS — R404 Transient alteration of awareness: Secondary | ICD-10-CM

## 2020-03-24 LAB — CBC
HCT: 28.7 % — ABNORMAL LOW (ref 36.0–46.0)
Hemoglobin: 9.5 g/dL — ABNORMAL LOW (ref 12.0–15.0)
MCH: 31.3 pg (ref 26.0–34.0)
MCHC: 33.1 g/dL (ref 30.0–36.0)
MCV: 94.4 fL (ref 80.0–100.0)
Platelets: 200 10*3/uL (ref 150–400)
RBC: 3.04 MIL/uL — ABNORMAL LOW (ref 3.87–5.11)
RDW: 13.3 % (ref 11.5–15.5)
WBC: 15.9 10*3/uL — ABNORMAL HIGH (ref 4.0–10.5)
nRBC: 0 % (ref 0.0–0.2)

## 2020-03-24 NOTE — NC FL2 (Signed)
[place] LEVEL OF CARE SCREENING TOOL     IDENTIFICATION  Patient Name: Melissa Noble Birthdate: 13-Jul-1923 Sex: female Admission Date (Current Location): 03/21/2020  [facility] and [place] Number:  Herbalist and Address:  [facility] [place] [address], [place] [address] [place], [place] [address]      Provider Number: [id number]  Attending Physician Name and Address:  Lucious Groves, DO  Relative Name and Phone Number:  carleena, mires (Son) [phone] [facility])    Current Level of Care: Hospital Recommended Level of Care: [place] Prior Approval Number:    Date Approved/Denied: 03/24/20 PASRR Number: [id number]  Discharge Plan: SNF    Current Diagnoses: Patient Active Problem List   Diagnosis Date Noted  . Hip fracture ([place]) 03/21/2020    Orientation RESPIRATION BLADDER Height & Weight     Self  Normal External catheter Weight: 72 lb (32.7 kg) Height:  5\' 1"  (154.9 cm)  BEHAVIORAL SYMPTOMS/MOOD NEUROLOGICAL BOWEL NUTRITION STATUS   (none)  (none) Continent Diet (see d/c summary)  AMBULATORY STATUS COMMUNICATION OF NEEDS Skin   Extensive Assist Verbally Surgical wounds (Left Hip incision Hydrocolloid dressing)                       Personal Care Assistance Level of Assistance  Bathing, Feeding, Dressing Bathing Assistance: Maximum assistance Feeding assistance: Independent Dressing Assistance: Limited assistance     Functional Limitations Info  Sight, Hearing, Speech Sight Info: Adequate Hearing Info: Adequate Speech Info: Adequate    SPECIAL CARE FACTORS FREQUENCY  PT (By licensed PT), OT (By licensed OT)     PT Frequency: 5x/week OT Frequency: 5x/week            Contractures Contractures Info: Not present    Additional Factors Info  Code Status Code Status Info: Full             Current Medications (03/24/2020):  This is the current hospital active medication list Current  Facility-Administered Medications  Medication Dose Route Frequency Provider Last Rate Last Admin  . 0.9 %  sodium chloride infusion  10 mL/hr Intravenous Once Renato Shin, CRNA      . clopidogrel (PLAVIX) tablet 75 mg  75 mg Oral Daily Magnant, Charles L, PA-C   75 mg at 03/23/20 1000  . docusate sodium (COLACE) capsule 100 mg  100 mg Oral BID Magnant, Charles L, PA-C   100 mg at 03/23/20 2038  . HYDROcodone-acetaminophen (NORCO/VICODIN) 5-325 MG per tablet 1 tablet  1 tablet Oral Q4H PRN Riesa Pope, MD   1 tablet at 03/23/20 0959  . losartan (COZAAR) tablet 100 mg  100 mg Oral Daily Katsadouros, Vasilios, MD   100 mg at 03/23/20 0959  . menthol-cetylpyridinium (CEPACOL) lozenge 3 mg  1 lozenge Oral PRN Magnant, Charles L, PA-C       Or  . phenol (CHLORASEPTIC) mouth spray 1 spray  1 spray Mouth/Throat PRN Magnant, Charles L, PA-C      . methocarbamol (ROBAXIN) tablet 500 mg  500 mg Oral Q6H PRN Magnant, Charles L, PA-C   500 mg at 03/23/20 2039   Or  . methocarbamol (ROBAXIN) 500 mg in dextrose 5 % 50 mL IVPB  500 mg Intravenous Q6H PRN Magnant, Charles L, PA-C      . metoCLOPramide (REGLAN) tablet 5-10 mg  5-10 mg Oral Q8H PRN Magnant, Charles L, PA-C       Or  . metoCLOPramide (REGLAN) injection 5-10  mg  5-10 mg Intravenous Q8H PRN Magnant, Charles L, PA-C      . morphine 2 MG/ML injection 0.5 mg  0.5 mg Intravenous Q4H PRN Magnant, Charles L, PA-C      . ondansetron (ZOFRAN) tablet 4 mg  4 mg Oral Q6H PRN Magnant, Charles L, PA-C       Or  . ondansetron (ZOFRAN) injection 4 mg  4 mg Intravenous Q6H PRN Magnant, Charles L, PA-C      . polyethylene glycol (MIRALAX / GLYCOLAX) packet 17 g  17 g Oral Daily PRN Maudie Mercury, MD      . simvastatin (ZOCOR) tablet 20 mg  20 mg Oral QHS Katsadouros, Vasilios, MD   20 mg at 03/23/20 2039     Discharge Medications: Please see discharge summary for a list of discharge medications.  Relevant Imaging Results:  Relevant Lab  Results:   Additional Information SSN [place], [place]

## 2020-03-24 NOTE — TOC CAGE-AID Note (Signed)
Transition of Care [facility]) - CAGE-AID Screening   Patient Details  Name: Melissa Noble MRN: [id number] Date of Birth: April 14, 1924  Transition of Care [facility]) CM/SW Contact:    Emeterio Reeve, [place] Phone Number: 03/24/2020, 3:51 PM   Clinical Narrative:  Pt is unable to participate in assessment due to being oriented to self only.  CAGE-AID Screening: Substance Abuse Screening unable to be completed due to: : Patient unable to participate            [place] Clinical Social Worker [phone]

## 2020-03-24 NOTE — Progress Notes (Signed)
Physical Therapy Treatment Patient Details Name: Melissa Noble MRN: [id number] DOB: 10/24/1923 Today's Date: 03/24/2020    History of Present Illness Melissa Noble is a 86 y/o F with a relevant past medical history of coronary artery disease, transient ischemic attack, right carotid artery stenosis, Stage 4 Kidney Disease, hyperlipidemia, and hypertension who presented to the ED from her retirement community after falling and having left hip pain. Yesterday the patient states she was getting into bed when noticed her husband start to fall, she attempted to catch him and fell herself. She states she landed on her left hip and hit her head. Went to bed; Called EMS in the morning, when her hip ws still quite painful; L hip fracture, now s/p L hip hemiarthroplasty, anterior approach, WBAT    PT Comments    Pt appeared more confused today and in pain. When I entered the room the NT was assisting the patient with bed mobility to dry her and change linens due to incontinence. Pt was not oriented to situation and was noticeably upset. I was able to assist with bed mobility and the patient was agreeable to LE bed exercises. Pt will continue to benefit from acute skilled PT to maximize mobility and Independence. Pt will likely need increased level of care to maximize mobility and independence for improved quality of life.  Follow Up Recommendations  SNF     Equipment Recommendations       Recommendations for Other Services       Precautions / Restrictions Precautions Precautions: Fall;Anterior Hip Restrictions LLE Weight Bearing: Weight bearing as tolerated    Mobility  Bed Mobility Overal bed mobility: Needs Assistance Bed Mobility: Rolling Rolling: Min assist         General bed mobility comments: cues for hand placement. Pt was disoriented and in pain this afternoon. Pt did not understand why we had to move her. Tried to reorient her and assisted the NT to complete self care and  bed mobilit.  Transfers                 General transfer comment: unable to attempt this afternoon due to pain level and cognition.  Ambulation/Gait                 Stairs             Wheelchair Mobility    Modified Rankin (Stroke Patients Only)       Balance                                            Cognition Arousal/Alertness: Awake/alert Behavior During Therapy: Agitated Overall Cognitive Status: No family/caregiver present to determine baseline cognitive functioning Area of Impairment: Orientation;Safety/judgement;Awareness;Problem solving;Memory                 Orientation Level: Disoriented to;Situation;Place;Time   Memory: Decreased recall of precautions;Decreased short-term memory   Safety/Judgement: Decreased awareness of safety;Decreased awareness of deficits Awareness: Intellectual Problem Solving: Slow processing;Requires verbal cues;Requires tactile cues;Difficulty sequencing General Comments: Walked into the room and the NT was helpting the patient roll to clean her pads and dry her off. Pt was noticably agitated secondary to pain and not understanding what was going on.      Exercises Total Joint Exercises Ankle Circles/Pumps: AROM;Both;Strengthening;10 reps;Supine Short Arc Quad: AAROM;Strengthening;Both;10 reps;Supine Heel Slides: AAROM;Strengthening;Both;10 reps;Supine Hip ABduction/ADduction: AAROM;Strengthening;Both;10 reps;Supine  General Comments General comments (skin integrity, edema, etc.): Pt apperared confused today. No family was present for the session. Pt tolerated some LE ther ex and bed mobility. Pt has no memory of walking yesterday.      Pertinent Vitals/Pain Pain Assessment: Faces Faces Pain Scale: Hurts whole lot Pain Location: Soreness L hip Pain Descriptors / Indicators: Discomfort;Guarding;Moaning Pain Intervention(s): Limited activity within patient's  tolerance;Repositioned;Monitored during session    Home Living                      Prior Function            PT Goals (current goals can now be found in the care plan section) Progress towards PT goals: Not progressing toward goals - comment (increased confusion and pain)    Frequency    Min 3X/week      PT Plan Current plan remains appropriate    Co-evaluation              AM-PAC PT "6 Clicks" Mobility   Outcome Measure  Help needed turning from your back to your side while in a flat bed without using bedrails?: A Little Help needed moving from lying on your back to sitting on the side of a flat bed without using bedrails?: A Lot Help needed moving to and from a bed to a chair (including a wheelchair)?: A Lot Help needed standing up from a chair using your arms (e.g., wheelchair or bedside chair)?: A Lot Help needed to walk in hospital room?: A Lot Help needed climbing 3-5 steps with a railing? : A Lot 6 Click Score: 13    End of Session   Activity Tolerance: Patient limited by pain Patient left: in bed;with call bell/phone within reach;with bed alarm set Nurse Communication: Mobility status PT Visit Diagnosis: Unsteadiness on feet (R26.81);Other abnormalities of gait and mobility (R26.89);Pain Pain - Right/Left: Left Pain - part of body: Hip     Time: 6579-0383 PT Time Calculation (min) (ACUTE ONLY): 30 min  Charges:  $Therapeutic Exercise: 8-22 mins $Therapeutic Activity: 8-22 mins                      Lelon Mast 03/24/2020, 3:21 PM

## 2020-03-24 NOTE — Progress Notes (Signed)
CSW received call from pt son who requested that search be expanded to [employer]. He stated that he would prefer [facility] if [facility] is unable to accept.

## 2020-03-24 NOTE — TOC Initial Note (Addendum)
Transition of Care [facility]) - Initial/Assessment Note    Patient Details  Name: [place] MRN: [id number] Date of Birth: 11/14/1923  Transition of Care [facility]) CM/SW Contact:    Bethann Berkshire, [place] Phone Number: 03/24/2020, 10:44 AM  Clinical Narrative:                  CSW met with pt to discuss SNF recommendation. Pt is not oriented to situation. CSW contacts pt's son, Verner Kopischke. He is a Estate agent and is familiar with SNF's through his work. He is okay with SNF workout. No specific preferences yet but will review once pt has offers. He states that pt and pt's husband live at [employer].   FL2/SNF workup complete. Bed requests sent       Expected Discharge Plan: [place] Barriers to Discharge: Continued Medical Work up   Patient Goals and CMS Choice        Expected Discharge Plan and Services Expected Discharge Plan: [place] Choice: [place] arrangements for the past 2 months: [place]                                      Prior Living Arrangements/Services Living arrangements for the past 2 months: [place] Lives with:: Spouse Patient language and need for interpreter reviewed:: Yes        Need for Family Participation in Patient Care: Yes (Comment) Care giver support system in place?: Yes (comment)   Criminal Activity/Legal Involvement Pertinent to Current Situation/Hospitalization: No - Comment as needed  Activities of Daily Living      Permission Sought/Granted   Permission granted to share information with : Yes, Verbal Permission Granted  Share Information with NAME: Perfetti,john (Son) [phone] (Mobile)           Emotional Assessment Appearance:: Appears stated age Attitude/Demeanor/Rapport:  (confused) Affect (typically observed): Accepting Orientation: : Oriented to Self Alcohol / Substance Use: Not  Applicable Psych Involvement: No (comment)  Admission diagnosis:  Hip fracture ([place]) [S72.009A] Closed fracture of left hip, initial encounter [facility]) [S72.002A] Patient Active Problem List   Diagnosis Date Noted  . Hip fracture ([place]) 03/21/2020   PCP:  Roetta Sessions, NP Pharmacy:  No Pharmacies Listed    Social Determinants of Health (SDOH) Interventions    Readmission Risk Interventions No flowsheet data found.

## 2020-03-24 NOTE — Care Management Important Message (Signed)
Important Message  Patient Details  Name: Melissa Noble MRN: [id number] Date of Birth: 1924/05/20   Medicare Important Message Given:  Yes     Holleigh Crihfield Montine Circle 03/24/2020, 12:42 PM

## 2020-03-24 NOTE — Progress Notes (Signed)
HD#3 Subjective:  Overnight Events: None  Melissa Noble was seen and evaluated at bedside, upon evaluation she was sitting up in bed eating breakfast. She states she is doing well, denies pain or shortness of breath. Recalls having an accident however cannot recall specific details. Reoriented the patient. Patient states she looks forward to working with rehab services. States she does not like the hospital food because it is dry.   Oriented to self and place but not time, "let me think about that." Able to identify Biden as president.   She has no other complaints or questions at the time of my examination   Objective:  Vital signs in last 24 hours: Vitals:   03/23/20 1523 03/23/20 1950 03/24/20 0334 03/24/20 1235  BP: 129/62 138/65 (!) 156/75 128/62  Pulse: 91 91 75 83  Resp: 16 18 17 18   Temp: 98.2 F (36.8 C) 97.9 F (36.6 C) 98 F (36.7 C) 98.4 F (36.9 C)  TempSrc: Oral Oral Oral Oral  SpO2: 100% 100% 99% 100%  Weight:      Height:       Supplemental O2: Nasal Cannula SpO2: 100 % O2 Flow Rate (L/min): 6 L/min   Physical Exam:  Physical Exam Constitutional:      Appearance: Normal appearance.  HENT:     Head: Normocephalic and atraumatic.  Cardiovascular:     Rate and Rhythm: Normal rate and regular rhythm.     Pulses: Normal pulses.     Heart sounds: Murmur (2/6 systolic murmur) heard.  No friction rub. No gallop.   Pulmonary:     Effort: Pulmonary effort is normal.     Breath sounds: Normal breath sounds. No wheezing, rhonchi or rales.  Abdominal:     General: Abdomen is flat.     Tenderness: There is no abdominal tenderness. There is no guarding.  Musculoskeletal:     Comments: Left lower extremity bandaged near surgical site.   Skin:    General: Skin is warm.  Neurological:     General: No focal deficit present.     Mental Status: She is alert and oriented to person, place, and time.     Comments: Oriented to self and place, but not time.      Filed Weights   03/21/20 0759  Weight: 32.7 kg     Intake/Output Summary (Last 24 hours) at 03/24/2020 1413 Last data filed at 03/24/2020 1104 Gross per 24 hour  Intake 220 ml  Output --  Net 220 ml   Net IO Since Admission: 739.07 mL [03/24/20 1413]  Pertinent Labs: CBC Latest Ref Rng & Units 03/24/2020 03/23/2020 03/21/2020  WBC 4.0 - 10.5 K/uL 15.9(H) 17.9(H) 14.0(H)  Hemoglobin 12.0 - 15.0 g/dL 9.5(L) 9.2(L) 10.1(L)  Hematocrit 36 - 46 % 28.7(L) 27.1(L) 30.9(L)  Platelets 150 - 400 K/uL 200 197 209    CMP Latest Ref Rng & Units 03/23/2020 03/21/2020  Glucose 70 - 99 mg/dL 121(H) 125(H)  BUN 8 - 23 mg/dL 46(H) 53(H)  Creatinine 0.44 - 1.00 mg/dL 1.72(H) 1.98(H)  Sodium 135 - 145 mmol/L 136 138  Potassium 3.5 - 5.1 mmol/L 4.0 4.3  Chloride 98 - 111 mmol/L 107 109  CO2 22 - 32 mmol/L 19(L) 19(L)  Calcium 8.9 - 10.3 mg/dL 9.4 9.8  Total Protein 6.5 - 8.1 g/dL - 6.1(L)  Total Bilirubin 0.3 - 1.2 mg/dL - 0.8  Alkaline Phos 38 - 126 U/L - 61  AST 15 - 41 U/L - 23  ALT 0 - 44 U/L - 22    Imaging: No results found.  Assessment/Plan:   Active Problems:   Hip fracture [facility])   Patient Summary: Melissa Noble is a 86 y/o F with a PMHx of coronary artery disease, transient ischemic attack, right carotid artery stenosis, Stage 4 Kidney Disease, hyperlipidemia, and hypertension with a mechanical fall and left hip pain. Upon presentation to the ED for consistent left hip pain, she was found to have a left subcapital femoral neck fracture. Patient evaluated by orthopedics and taken to the OR for repair of the left femoral neck fracture. Post op day 2 patient denies any pain. Will continue to monitor post operatively with plan to discharge patient to SNF facility.   Left Subcapital Femoral Neck Fracture Secondary to Mechanical Fall Patient with mechanical fall after attempting to prevent her husband from falling and injuring her left hip. Patient denies loss of  consciousness. X-ray imaging revealed left subcapital femoral neck fracture. Low suspicion fall was due to syncopal episode, neurological or cardiac event. Patient evaluated by orthopedics who will take patient to OR tomorrow. Patient to be cleared by medical team per orthopedics. Patient with 2 points on revised cardiac risk index, class III risk. 10.1% 30 day risk of death, MI, or cardiac arrest. Patient on statin therapy as well as plavix.   Patient currently post op day 2 for left hip hemiarthroplasty, performed by Dr. [name] of orthopedic surgery. Total blood loss of 100 mL recorded. No complications occurred during the procedure. The patient is alert and oriented to only person, suspect change is due to anesthetics administered during the surgical procedure will continue to monitor status post operatively.   - Post-op day 2 for left hip hemiarthroplasty - PT Eval and treat - Back on Plavix - Pain control with 500 mg acetaminophen q6h for mild pain, norco 1 tab q4h PRN for severe pain - Orthopedics will continue to follow, appreciate their recommendations at this time.   History of Chronic Kidney Disease Stage 4 Per care everywhere review, patient with history of CKD stage 4, baseline Cr of 1.65-1.96 over the last year. Etiology of ischemic nephropathy and HTN. Upon admission creatinine of 1.98.  - Cr improved to 1.72 from 1.98 yesterday - Trend BMP  Normocytic Anemia Patient with hemoglobin of 10.1 and MCV of 96. Do not suspect active bleeding. Patient with past medical history of anemia due to chronic kidney disease. Will continue to monitor and transfuse as needed. CBC currently pending, nursing staff having difficult time finding access at this time for blood draw.  - Hgb 9.5 from 9.2 yesterday - Trend CBC - Consider transfusion for transfusion if hemoglobin below 7.    Hypertension Blood pressure of 152/69. Med rec states patient is taking HCTZ and losartan, however, upon chart  review, last PCP note from 11/2019 states discontinue HCTZ. Will continue losartan only in light of PCP note and patient's chronic kidney disease. - Continue to monitor blood pressure - Continue home losartan 100 mg daily  Hyperlipidemia Per chart review LDL level of 138 on 09/2019. Will continue home medication of simvastatin 20 mg daily - Continue simvastatin 20 mg daily  Urge Incontinence Patient on oxybutynin 10 mg 24 hr tab, will continue to hold due to recent episode of altered mental status. Strict I/o's and will monitor urine output. Will bladder scan as needed.  History of CAD Patient with CAD s/p stent in 1995. Will continue nitroglycerin PRN post operatively.   History  of TIA Patient with history of TIA - Back on home 75 mg plavix  Systolic Murmur 2/6 systolic murmur on examination, consistent with aortic stenosis on echo from 2020. Will continue to monitor.  Transient Altered Mental Status Resolved  Diet: Heart Healthy IVF: LR,75cc/hr VTE: SCDs Code: Full PT/OT recs: Pending  Dispo: Anticipated discharge to Skilled nursing facility in 2 days pending SNF placement and Orthopedics signing off.  Sanjuana Letters DO Internal Medicine Resident PGY-1  Pager [phone] Please contact the on call pager after 5 pm and on weekends at [phone].

## 2020-03-25 ENCOUNTER — Encounter (HOSPITAL_COMMUNITY): Payer: Self-pay | Admitting: Orthopedic Surgery

## 2020-03-25 DIAGNOSIS — Z681 Body mass index (BMI) 19 or less, adult: Secondary | ICD-10-CM

## 2020-03-25 LAB — CBC
HCT: 29.8 % — ABNORMAL LOW (ref 36.0–46.0)
Hemoglobin: 10 g/dL — ABNORMAL LOW (ref 12.0–15.0)
MCH: 31.3 pg (ref 26.0–34.0)
MCHC: 33.6 g/dL (ref 30.0–36.0)
MCV: 93.4 fL (ref 80.0–100.0)
Platelets: 249 10*3/uL (ref 150–400)
RBC: 3.19 MIL/uL — ABNORMAL LOW (ref 3.87–5.11)
RDW: 13.1 % (ref 11.5–15.5)
WBC: 11.9 10*3/uL — ABNORMAL HIGH (ref 4.0–10.5)
nRBC: 0 % (ref 0.0–0.2)

## 2020-03-25 LAB — SARS CORONAVIRUS 2 BY RT PCR (HOSPITAL ORDER, PERFORMED IN ~~LOC~~ HOSPITAL LAB): SARS Coronavirus 2: NEGATIVE

## 2020-03-25 MED ORDER — ACETAMINOPHEN 500 MG PO TABS: 500.0000 mg | ORAL_TABLET | Freq: Four times a day (QID) | ORAL | 0 refills | Status: AC | PRN

## 2020-03-25 NOTE — Plan of Care (Signed)
Pt discharging to SNF for further therapy, PT/OT.

## 2020-03-25 NOTE — TOC Transition Note (Addendum)
Transition of Care [facility]) - CM/SW Discharge Note   Patient Details  Name: Melissa Noble MRN: [id number] Date of Birth: 10-10-1923  [facility] CM/SW Contact:  Melissa Noble, [place] Phone Number: 03/25/2020, 2:35 PM   Clinical Narrative:     Patient will DC to: [place] date: 03/25/20 Family notified: Melissa Noble Son Transport by: Melissa Noble   Per MD patient ready for DC to [facility] . RN, patient, patient's family, and facility notified of DC. Discharge Summary and FL2 sent to facility. RN to call report prior to discharge [phone] room 102P). DC packet on chart. Ambulance transport requested for patient.   CSW will sign off for now as social work intervention is no longer needed. Please consult [place] again if new needs arise.   Final next level of care: Skilled Nursing Facility Barriers to Discharge: No Barriers Identified   Patient Goals and CMS Choice     Choice offered to / list presented to : [facility] POA / Guardian, Adult Children  Discharge Placement              Patient chooses bed at: [facility] Patient to be transferred to facility by: Melissa Noble Name of family member notified: Melissa Noble Son Patient and family notified of of transfer: 03/25/20  Discharge Plan and Services     Post Acute Care Choice: [place]                               Social Determinants of Health (SDOH) Interventions     Readmission Risk Interventions No flowsheet data found.

## 2020-03-25 NOTE — Progress Notes (Signed)
Pt discharging to SNF, [facility].  Copy of instructions printed and sent with transporters for facility.  Pt d/c'd with belongings, transported by [place].

## 2020-03-25 NOTE — Progress Notes (Signed)
HD#4 Subjective:  Overnight Events: None  Melissa Noble was seen and evaluated at bedside. She is doing well, currently denies any pain. She is alert to person, place, and time of year. She was reoriented to her situation. She is pleasant upon examination. She has no complaints at the time of my examination.   Objective:  Vital signs in last 24 hours: Vitals:   03/24/20 1235 03/24/20 2046 03/25/20 0419 03/25/20 1340  BP: 128/62 (!) 161/78 (!) 143/63 129/62  Pulse: 83 97 (!) 101 87  Resp: 18 18 16 18   Temp: 98.4 F (36.9 C) 98 F (36.7 C) 99.8 F (37.7 C) 98.6 F (37 C)  TempSrc: Oral Oral Oral Oral  SpO2: 100% 100% 98% 99%  Weight:      Height:       Supplemental O2: Nasal Cannula SpO2: 99 % O2 Flow Rate (L/min): 6 L/min   Physical Exam:  Physical Exam Constitutional:      Appearance: Normal appearance.     Comments: pleasant  HENT:     Head: Normocephalic and atraumatic.  Cardiovascular:     Rate and Rhythm: Normal rate and regular rhythm.     Pulses: Normal pulses.     Heart sounds: Murmur (2/6 systolic murmur) heard.  No friction rub. No gallop.   Pulmonary:     Effort: Pulmonary effort is normal.  Abdominal:     General: Abdomen is flat.  Musculoskeletal:     Comments: Left lower extremity bandaged near surgical site.   Skin:    General: Skin is warm.  Neurological:     General: No focal deficit present.     Mental Status: She is alert.     Comments: Patient alert and oriented to person, place (hospital) time (year). Unable to state what city we are in. Able to say she fell but unsure as to how she fell.   Psychiatric:        Mood and Affect: Mood normal.     Filed Weights   03/21/20 0759  Weight: 32.7 kg     Intake/Output Summary (Last 24 hours) at 03/25/2020 2059 Last data filed at 03/24/2020 2252 Gross per 24 hour  Intake 150 ml  Output --  Net 150 ml   Net IO Since Admission: 999.07 mL [03/25/20 2059]  Pertinent Labs: CBC Latest  Ref Rng & Units 03/25/2020 03/24/2020 03/23/2020  WBC 4.0 - 10.5 K/uL 11.9(H) 15.9(H) 17.9(H)  Hemoglobin 12.0 - 15.0 g/dL 10.0(L) 9.5(L) 9.2(L)  Hematocrit 36 - 46 % 29.8(L) 28.7(L) 27.1(L)  Platelets 150 - 400 K/uL 249 200 197    CMP Latest Ref Rng & Units 03/23/2020 03/21/2020  Glucose 70 - 99 mg/dL 121(H) 125(H)  BUN 8 - 23 mg/dL 46(H) 53(H)  Creatinine 0.44 - 1.00 mg/dL 1.72(H) 1.98(H)  Sodium 135 - 145 mmol/L 136 138  Potassium 3.5 - 5.1 mmol/L 4.0 4.3  Chloride 98 - 111 mmol/L 107 109  CO2 22 - 32 mmol/L 19(L) 19(L)  Calcium 8.9 - 10.3 mg/dL 9.4 9.8  Total Protein 6.5 - 8.1 g/dL - 6.1(L)  Total Bilirubin 0.3 - 1.2 mg/dL - 0.8  Alkaline Phos 38 - 126 U/L - 61  AST 15 - 41 U/L - 23  ALT 0 - 44 U/L - 22    Imaging: No results found.  Assessment/Plan:   Active Problems:   Hip fracture (HCC)   BMI less than 19,adult   Patient Summary: Melissa Noble is a 86 y/o  F with a PMHx of coronary artery disease, transient ischemic attack, right carotid artery stenosis, Stage 4 Kidney Disease, hyperlipidemia, and hypertension with a mechanical fall and left hip pain. Upon presentation to the ED for consistent left hip pain, she was found to have a left subcapital femoral neck fracture. Patient evaluated by orthopedics and taken to the OR for repair of the left femoral neck fracture. Post op day 3 patient denies any pain. Will continue to monitor post operatively with plan to discharge patient to SNF facility today  Left Subcapital Femoral Neck Fracture Secondary to Mechanical Fall Patient with mechanical fall after attempting to prevent her husband from falling and injuring her left hip. Patient denies loss of consciousness. X-ray imaging revealed left subcapital femoral neck fracture. Low suspicion fall was due to syncopal episode, neurological or cardiac event. Patient evaluated by orthopedics who will take patient to OR tomorrow. Patient to be cleared by medical team per  orthopedics. Patient with 2 points on revised cardiac risk index, class III risk. 10.1% 30 day risk of death, MI, or cardiac arrest. Patient on statin therapy as well as plavix.   Patient currently post op day 3 for left hip hemiarthroplasty, performed by Dr. Marlou Noble of orthopedic surgery. Total blood loss of 100 mL recorded. No complications occurred during the procedure. The patient is progressing well and will be discharged to SNF facility per physical therapy recommendations.   - Post-op day 3 for left hip hemiarthroplasty - PT Eval and treat - SNF placement - Back on Plavix - Pain control with 500 mg acetaminophen q6h for mild pain, norco 1 tab q4h PRN for severe pain - Orthopedics signed off, patient clear for SNF placement  History of Chronic Kidney Disease Stage 4 Per care everywhere review, patient with history of CKD stage 4, baseline Cr of 1.65-1.96 over the last year. Etiology of ischemic nephropathy and HTN. Upon admission creatinine of 1.98.  - Cr 1.72 yesterday - Trend BMP  Normocytic Anemia Patient with hemoglobin of 10 and MCV of 93.4. Do not suspect active bleeding. Patient with past medical history of anemia due to chronic kidney disease. Will continue to monitor and transfuse as needed.  - Hgb 10.0  stable - Consider transfusion for transfusion if hemoglobin below 7.    Hypertension Blood pressure of 152/69. Med rec states patient is taking HCTZ and losartan, however, upon chart review, last PCP note from 11/2019 states discontinue HCTZ. Will continue losartan only in light of PCP note and patient's chronic kidney disease. - Continue to monitor blood pressure - Continue home losartan 100 mg daily  Hyperlipidemia Per chart review LDL level of 138 on 09/2019. Will continue home medication of simvastatin 20 mg daily - Continue simvastatin 20 mg daily  Urge Incontinence Patient on oxybutynin 10 mg 24 hr tab, will continue to hold due to recent episode of altered mental  status. Strict I/o's and will monitor urine output. Will bladder scan as needed.  History of CAD Patient with CAD s/p stent in 1995. Will continue nitroglycerin PRN post operatively.   History of TIA Patient with history of TIA - Back on home 75 mg plavix  Systolic Murmur 2/6 systolic murmur on examination, consistent with aortic stenosis on echo from 2020. Will continue to monitor.  Transient Altered Mental Status Resolved  Diet: Heart Healthy IVF: LR,75cc/hr VTE: SCDs Code: Full PT/OT recs: Pending  Dispo: Anticipated discharge to Skilled nursing facility today  [place] Internal Medicine Resident PGY-1  Pager  [phone] Please contact the on call pager after 5 pm and on weekends at [phone].

## 2020-03-25 NOTE — Discharge Summary (Signed)
Name: Melissa Noble MRN: [id number] DOB: 19-Oct-1923 86 y.o. PCP: Roetta Sessions, NP  Date of Admission: 03/21/2020  7:45 AM Date of Discharge:  Attending Physician: Dr. Angelia Mould  Discharge Diagnosis: Active Problems:   Hip fracture ([place])   BMI less than 19,adult    Discharge Medications: Allergies as of 03/25/2020      Reactions   Ticlopidine Hives, Itching, Swelling, Rash   Depakote [divalproex Sodium] Other (See Comments)   Unknown    Penicillins Rash      Medication List    TAKE these medications   acetaminophen 500 MG tablet Commonly known as: TYLENOL Take 1 tablet (500 mg total) by mouth every 6 (six) hours as needed.   Centrum Silver tablet Take 1 tablet by mouth daily.   clopidogrel 75 MG tablet Commonly known as: PLAVIX Take 75 mg by mouth daily.   cycloSPORINE 0.05 % ophthalmic emulsion Commonly known as: RESTASIS Place 1 drop into both eyes 2 (two) times daily. 8am and 8pm   hydrochlorothiazide 12.5 MG tablet Commonly known as: HYDRODIURIL Take 12.5 mg by mouth daily.   levocetirizine 5 MG tablet Commonly known as: XYZAL Take 5 mg by mouth daily.   losartan 100 MG tablet Commonly known as: COZAAR Take 100 mg by mouth daily.   nitroGLYCERIN 0.4 MG SL tablet Commonly known as: NITROSTAT Place 0.4 mg under the tongue every 5 (five) minutes as needed for chest pain.   olopatadine 0.1 % ophthalmic solution Commonly known as: PATANOL Place 1 drop into both eyes daily.   oxybutynin 10 MG 24 hr tablet Commonly known as: DITROPAN-XL Take 10 mg by mouth daily.   Polyethylene Glycol 400 0.25 % Soln Place 1 drop into both eyes at bedtime.   prednisoLONE Acetate P-F 1 % ophthalmic suspension Generic drug: prednisoLONE acetate Place 1 drop into both eyes 3 (three) times daily. 8am, 2pm, and 8pm   simvastatin 20 MG tablet Commonly known as: ZOCOR Take 20 mg by mouth at bedtime.            Durable Medical Equipment  (From  admission, onward)         Start     Ordered   03/24/20 0740  For home use only DME Walker rolling  Once       Question Answer Comment  Walker: With 5 Inch Wheels   Patient needs a walker to treat with the following condition Hip fracture ([place])      03/24/20 0740           Discharge Care Instructions  (From admission, onward)         Start     Ordered   03/25/20 0000  Leave dressing on - Keep it clean, dry, and intact until clinic visit        03/25/20 1330          Disposition and follow-up:   [name] was discharged from [facility] in Stable condition.  At the hospital follow up visit please address:  1.  Follow-up:  A. Left Hip Fracture  2.  Labs / imaging needed at time of follow-up: None  3.  Pending labs/ test needing follow-up: None  Follow-up Appointments:  Contact information for after-discharge care    Destination    [facility] .   Service: Skilled Nursing Contact information: [place] [address] [place] [phone]  Hospital Course by problem list: No notes on file   1. Left Subcapital Femoral Neck Fracture Secondary to Mechanical Fall  Patient with mechanical fall after attempting to prevent her husband from falling and injuring her left hip. Patient denied loss of consciousness. X-ray imaging revealed left subcapital femoral neck fracture. Low suspicion fall was due to syncopal episode, neurological or cardiac event. Patient evaluated by orthopedics who took patient to the operating room. Patient was medically cleared with 2 points on revised cardiac risk index, class III risk. 10.1% 30 day risk of death, MI, or cardiac arrest. Patient on statin therapy as well as plavix.    Patient currently post op day 3 for left hip hemiarthroplasty, performed by Dr. August Saucer of orthopedic surgery. Total blood loss of 100 mL recorded. No complications occurred during  the procedure. The patient is alert and oriented to person, place, and time at discharge.   2. Transient Altered Mental Status  The internal medicine team was paged to the pre-operative room at approximately 0730 this am because the patient had an acute change in mental status and was no longer responding to verbal stimuli. On bedside in pre-op, the patient was unresponsive with decreased respirations and hypertensive. Nursing staff were unsure when the acute change onset. Patient did appear to have pinpoint pupils with decreased respirations upon examination. No focal deficit was noted. Patient with reflexive flexion of the upper extremities upon sternal rub. At this time, it was thought the patient had lingering opioids in her system, due to her history of CKD, causing increasing amounts of opioids causing respiratory depression and decreased mentation. Also in the differential was an acute hemorrhagic stroke  An acute hemorrhagic stroke was suspected as when the patient initially fell, prior to arrival to the ED yesterday, she hit her head. The patient did not lose consciousness. She is on chronic plavix so a CT without contrast was performed in the ED which was negative, for any acute intracranial process'.  However, with the acute change in mental status that had occurred since the patient's admission, a stroke could not be ruled out without further imaging.   The initial thought was that the patient's change in mental status was due to the opioids because of the acute change in mentation and respiratory depression. The patient received .4 mg of narcan IV. There was no response after approximately 5-10 minute so an additional .4 mg was administered. The patient's respirations did improve with this, however, the patient remained unresponsive to verbal stimuli and because of this, a suspected code stroke was called by the internal medicine team.   The patient was transported to CT scan for stat  imaging by rapid response and neurology was consulted to activate a potential code stroke. Dr. Otelia Limes, neurologist on call, returned my page and I presented the patient to him, informing him of the patient's admission and hospital course thus far.   I was unable to inform Dr. Otelia Limes the exact time the patient was at her baseline status nor did I know the patient's modified rankin scale for neurological disability. As such, it was difficult to determine the acuteness of this change and how to proceed.   Dr. Otelia Limes was met at the CT scanner and examined the patient at bedside with the internal medicine team at bedside. No focal neurological deficit was found and the patient was was responding to verbal stimuli, an improvement from our initial encounter this morning. At this time, our suspicion was lowered that the  patient was having an acute hemorrhagic stroke. Her head CT without contrast was negative for an acute bleed. The patient was then taken to her orthopedic procedure.   At time of discharge the patient was at her baseline mental status.    Discharge Vitals:   BP (!) 143/63 (BP Location: Left Arm)   Pulse (!) 101   Temp 99.8 F (37.7 C) (Oral)   Resp 16   Ht $R'5\' 1"'zC$  (1.549 m)   Wt 32.7 kg   SpO2 98%   BMI 13.60 kg/m   Pertinent Labs, Studies, and Procedures:  CBC Latest Ref Rng & Units 03/25/2020 03/24/2020 03/23/2020  WBC 4.0 - 10.5 K/uL 11.9(H) 15.9(H) 17.9(H)  Hemoglobin 12.0 - 15.0 g/dL 10.0(L) 9.5(L) 9.2(L)  Hematocrit 36 - 46 % 29.8(L) 28.7(L) 27.1(L)  Platelets 150 - 400 K/uL 249 200 197    CMP Latest Ref Rng & Units 03/23/2020 03/21/2020  Glucose 70 - 99 mg/dL 121(H) 125(H)  BUN 8 - 23 mg/dL 46(H) 53(H)  Creatinine 0.44 - 1.00 mg/dL 1.72(H) 1.98(H)  Sodium 135 - 145 mmol/L 136 138  Potassium 3.5 - 5.1 mmol/L 4.0 4.3  Chloride 98 - 111 mmol/L 107 109  CO2 22 - 32 mmol/L 19(L) 19(L)  Calcium 8.9 - 10.3 mg/dL 9.4 9.8  Total Protein 6.5 - 8.1 g/dL - 6.1(L)  Total  Bilirubin 0.3 - 1.2 mg/dL - 0.8  Alkaline Phos 38 - 126 U/L - 61  AST 15 - 41 U/L - 23  ALT 0 - 44 U/L - 22    DG Chest 1 View  Result Date: 03/21/2020 CLINICAL DATA:  Pain following fall EXAM: CHEST  1 VIEW COMPARISON:  None. FINDINGS: Lungs are clear. Heart size and pulmonary vascularity are normal. No adenopathy. There is aortic atherosclerosis. There is calcification in each carotid artery with a stent in the left carotid artery region. There is no evident pneumothorax. No fracture. Bones are osteoporotic. IMPRESSION: Lungs clear. Cardiac silhouette normal. No pneumothorax. Aortic atherosclerosis as well as carotid artery calcification bilaterally. Bones osteoporotic. Aortic Atherosclerosis (ICD10-I70.0). Electronically Signed   By: Lowella Grip III M.D.   On: 03/21/2020 08:58   CT Head Wo Contrast  Result Date: 03/21/2020 CLINICAL DATA:  Fall on blood thinners.  Head trauma.  Neck pain. EXAM: CT HEAD WITHOUT CONTRAST CT CERVICAL SPINE WITHOUT CONTRAST TECHNIQUE: Multidetector CT imaging of the head and cervical spine was performed following the standard protocol without intravenous contrast. Multiplanar CT image reconstructions of the cervical spine were also generated. COMPARISON:  None. FINDINGS: CT HEAD FINDINGS Brain: No evidence of acute infarction, hemorrhage, hydrocephalus, extra-axial collection or mass lesion/mass effect. Right temporal lobe encephalomalacia compatible with remote ischemia. Extensive low-density changes within the periventricular and subcortical white matter compatible with chronic microvascular ischemic change. Mild diffuse cerebral volume loss. Vascular: Atherosclerotic calcifications involving the large vessels of the skull base. No unexpected hyperdense vessel. Skull: Normal. Negative for fracture or focal lesion. Sinuses/Orbits: No acute finding. Other: None. CT CERVICAL SPINE FINDINGS Alignment: Facet joints are aligned without dislocation or traumatic listhesis.  Dens and lateral masses are aligned. Trace retrolisthesis C5 on C6. Skull base and vertebrae: Subtle superior endplate depression of the T2 vertebral body where there is a small superior endplate Schmorl's node. Findings are favored chronic. Elsewhere. No evidence of acute fracture. No suspicious bone lesion. Soft tissues and spinal canal: No prevertebral fluid or swelling. No visible canal hematoma. Disc levels: Advanced degenerative disc disease of C5-6 with prominent endplate spurring  and uncovertebral arthropathy. Remaining disc heights are relatively preserved. There is multilevel bilateral advanced facet arthropathy throughout the cervical spine. Upper chest: Mild biapical pleuroparenchymal scarring within the visualized lung apices. Other: Heterogeneous thyroid containing numerous small nodules. Left-sided carotid stent. IMPRESSION: CT head: 1. No CT evidence of acute intracranial process. 2. Right temporal lobe encephalomalacia compatible with remote ischemia. 3. Chronic microvascular ischemic change and cerebral volume loss. CT cervical spine: 1. No evidence of acute fracture or traumatic listhesis of the cervical spine. 2. Subtle superior endplate depression of the T2 vertebral body where there is a small endplate Schmorl's node. Findings are favored chronic/degenerative. Correlate for point tenderness at this level. 3. Advanced degenerative disc disease of C5-6 and advanced multilevel facet arthropathy. 4. Heterogeneous thyroid containing numerous small nodules. In the setting of significant comorbidities or limited life expectancy, no follow-up recommended (ref: J Am Coll Radiol. 2015 Feb;12(2): 143-50). Electronically Signed   By: Duanne Guess D.O.   On: 03/21/2020 09:26   CT Cervical Spine Wo Contrast  Result Date: 03/21/2020 CLINICAL DATA:  Fall on blood thinners.  Head trauma.  Neck pain. EXAM: CT HEAD WITHOUT CONTRAST CT CERVICAL SPINE WITHOUT CONTRAST TECHNIQUE: Multidetector CT imaging of  the head and cervical spine was performed following the standard protocol without intravenous contrast. Multiplanar CT image reconstructions of the cervical spine were also generated. COMPARISON:  None. FINDINGS: CT HEAD FINDINGS Brain: No evidence of acute infarction, hemorrhage, hydrocephalus, extra-axial collection or mass lesion/mass effect. Right temporal lobe encephalomalacia compatible with remote ischemia. Extensive low-density changes within the periventricular and subcortical white matter compatible with chronic microvascular ischemic change. Mild diffuse cerebral volume loss. Vascular: Atherosclerotic calcifications involving the large vessels of the skull base. No unexpected hyperdense vessel. Skull: Normal. Negative for fracture or focal lesion. Sinuses/Orbits: No acute finding. Other: None. CT CERVICAL SPINE FINDINGS Alignment: Facet joints are aligned without dislocation or traumatic listhesis. Dens and lateral masses are aligned. Trace retrolisthesis C5 on C6. Skull base and vertebrae: Subtle superior endplate depression of the T2 vertebral body where there is a small superior endplate Schmorl's node. Findings are favored chronic. Elsewhere. No evidence of acute fracture. No suspicious bone lesion. Soft tissues and spinal canal: No prevertebral fluid or swelling. No visible canal hematoma. Disc levels: Advanced degenerative disc disease of C5-6 with prominent endplate spurring and uncovertebral arthropathy. Remaining disc heights are relatively preserved. There is multilevel bilateral advanced facet arthropathy throughout the cervical spine. Upper chest: Mild biapical pleuroparenchymal scarring within the visualized lung apices. Other: Heterogeneous thyroid containing numerous small nodules. Left-sided carotid stent. IMPRESSION: CT head: 1. No CT evidence of acute intracranial process. 2. Right temporal lobe encephalomalacia compatible with remote ischemia. 3. Chronic microvascular ischemic change  and cerebral volume loss. CT cervical spine: 1. No evidence of acute fracture or traumatic listhesis of the cervical spine. 2. Subtle superior endplate depression of the T2 vertebral body where there is a small endplate Schmorl's node. Findings are favored chronic/degenerative. Correlate for point tenderness at this level. 3. Advanced degenerative disc disease of C5-6 and advanced multilevel facet arthropathy. 4. Heterogeneous thyroid containing numerous small nodules. In the setting of significant comorbidities or limited life expectancy, no follow-up recommended (ref: J Am Coll Radiol. 2015 Feb;12(2): 143-50). Electronically Signed   By: Duanne Guess D.O.   On: 03/21/2020 09:26   DG C-Arm 1-60 Min  Result Date: 03/22/2020 CLINICAL DATA:  Left hip fracture EXAM: OPERATIVE LEFT HIP (WITH PELVIS IF PERFORMED) 2 VIEWS TECHNIQUE: Fluoroscopic spot image(s)  were submitted for interpretation post-operatively. FLUOROSCOPY TIME:  17 seconds COMPARISON:  Left hip radiographs-03/21/2020 FINDINGS: 2 spot intraoperative fluoroscopic images of the left hip and lower pelvis are provided for review. Images demonstrate the sequela of left bipolar hip replacement. Alignment appears anatomic given AP projection. There is a minimal amount of subcutaneous emphysema scattered about the operative site. A radiopaque clamp overlies the contralateral right hemipelvis, likely external to the patient. No definite radiopaque foreign body Limited visualization of the pelvis demonstrates several phleboliths overlying the lower pelvis bilaterally. Scattered adjacent vascular calcifications. 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 Electronically Signed   By: Simonne Come M.D.   On: 03/22/2020 14:07   DG HIP OPERATIVE UNILAT W OR W/O PELVIS LEFT  Result Date: 03/22/2020 CLINICAL DATA:  Left hip fracture EXAM: OPERATIVE LEFT HIP (WITH PELVIS IF PERFORMED) 2 VIEWS TECHNIQUE: Fluoroscopic spot  image(s) were submitted for interpretation post-operatively. FLUOROSCOPY TIME:  17 seconds COMPARISON:  Left hip radiographs-03/21/2020 FINDINGS: 2 spot intraoperative fluoroscopic images of the left hip and lower pelvis are provided for review. Images demonstrate the sequela of left bipolar hip replacement. Alignment appears anatomic given AP projection. There is a minimal amount of subcutaneous emphysema scattered about the operative site. A radiopaque clamp overlies the contralateral right hemipelvis, likely external to the patient. No definite radiopaque foreign body Limited visualization of the pelvis demonstrates several phleboliths overlying the lower pelvis bilaterally. Scattered adjacent vascular calcifications. 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 Electronically Signed   By: Simonne Come M.D.   On: 03/22/2020 14:07   DG Hip Unilat With Pelvis 2-3 Views Left  Result Date: 03/21/2020 CLINICAL DATA:  Pain following fall EXAM: DG HIP (WITH OR WITHOUT PELVIS) 2-3V LEFT COMPARISON:  None. FINDINGS: Frontal pelvis as well as frontal and lateral left hip images were obtained. There is a subcapital femoral neck fracture on the left with impaction and mild varus angulation at the fracture site. No other fracture. No dislocation. Bones are osteoporotic. There is mild symmetric narrowing of each hip joint. No erosion. There are multiple foci of arterial vascular calcification IMPRESSION: Left subcapital femoral neck fracture with mild varus angulation and impaction at the fracture site. No other fracture. No dislocation. Symmetric narrowing of each hip joint. Bones diffusely osteoporotic. Multiple foci of arterial vascular calcification noted. Electronically Signed   By: Bretta Bang III M.D.   On: 03/21/2020 08:57   CT HEAD CODE STROKE WO CONTRAST  Result Date: 03/22/2020 CLINICAL DATA:  Code stroke.  Aphasia.  Mental status changes. EXAM: CT HEAD WITHOUT  CONTRAST TECHNIQUE: Contiguous axial images were obtained from the base of the skull through the vertex without intravenous contrast. COMPARISON:  03/21/2020 FINDINGS: Brain: Generalized atrophy. Extensive chronic small-vessel ischemic changes throughout the cerebral hemispheric white matter. 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 No CT evidence of acute infarction, mass lesion, hemorrhage, hydrocephalus or extra-axial collection. Vascular: No acute vascular finding. Skull: Negative Sinuses/Orbits: No significant sinus inflammatory disease. Orbits negative. Other: None ASPECTS ([place]berta Stroke Program Early CT Score) - Ganglionic level infarction (caudate, lentiform nuclei, internal capsule, insula, M1-M3 cortex): 7 - Supraganglionic infarction (M4-M6 cortex): 3 Total score (0-10 with 10 being normal): 10 IMPRESSION: 1. No acute finding by CT. Atrophy and extensive chronic small-vessel ischemic changes. 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 2. ASPECTS is 10. 3. These results were communicated to Dr. Otelia Limes at 8:15 amon 10/9/2021by text page via the [facility] messaging system. Electronically  Signed   By: Nelson Chimes M.D.   On: 03/22/2020 08:16     Discharge Instructions: Discharge Instructions    Call MD for:  difficulty breathing, headache or visual disturbances   Complete by: As directed    Call MD for:  persistant dizziness or light-headedness   Complete by: As directed    Call MD for:  persistant nausea and vomiting   Complete by: As directed    Call MD for:  redness, tenderness, or signs of infection (pain, swelling, redness, odor or green/yellow discharge around incision site)   Complete by: As directed    Call MD for:  severe uncontrolled pain   Complete by: As directed    Call MD for:  temperature >100.4   Complete by: As directed    Diet - low sodium heart healthy   Complete by: As directed    Diet general   Complete by: As directed    Increase activity  slowly   Complete by: As directed    Leave dressing on - Keep it clean, dry, and intact until clinic visit   Complete by: As directed       Signed: Riesa Pope, MD 03/25/2020, 1:30 PM   Pager: [phone]

## 2020-03-25 NOTE — Progress Notes (Signed)
Report called to [facility], spoke with nurse Fatmata.

## 2020-03-26 LAB — TYPE AND SCREEN
ABO/RH(D): A POS
Antibody Screen: NEGATIVE
Unit division: 0
Unit division: 0

## 2020-03-26 LAB — BPAM RBC
Blood Product Expiration Date: 202111022359
Blood Product Expiration Date: 202111022359
Unit Type and Rh: 6200
Unit Type and Rh: 6200

## 2020-04-10 ENCOUNTER — Ambulatory Visit (INDEPENDENT_AMBULATORY_CARE_PROVIDER_SITE_OTHER): Payer: Medicare Other

## 2020-04-10 ENCOUNTER — Other Ambulatory Visit: Payer: Self-pay

## 2020-04-10 ENCOUNTER — Ambulatory Visit (INDEPENDENT_AMBULATORY_CARE_PROVIDER_SITE_OTHER): Payer: Medicare Other | Admitting: Orthopedic Surgery

## 2020-04-10 DIAGNOSIS — S72002D Fracture of unspecified part of neck of left femur, subsequent encounter for closed fracture with routine healing: Secondary | ICD-10-CM | POA: Diagnosis not present

## 2020-04-12 ENCOUNTER — Encounter: Payer: Self-pay | Admitting: Orthopedic Surgery

## 2020-04-12 NOTE — Progress Notes (Signed)
   Post-Op Visit Note   Patient: Melissa Noble           Date of Birth: Oct 06, 1923           MRN: [id number] Visit Date: 04/10/2020 PCP: Roetta Sessions, NP   Assessment & Plan:  Chief Complaint:  Chief Complaint  Patient presents with  . Left Hip - Routine Post Op   Visit Diagnoses:  1. Closed fracture of left hip with routine healing, subsequent encounter     Plan: Melissa is a 86 year old patient who is now 2 weeks out left hip hemiarthroplasty for fracture.  She has been doing well.  On exam she has equal leg lengths no pain with hip range of motion intact incision.  Radiographs look good.  Plan is to continue physical therapy for strengthening.  Follow-up as needed  Follow-Up Instructions: Return if symptoms worsen or fail to improve.   Orders:  Orders Placed This Encounter  Procedures  . XR HIP UNILAT W OR W/O PELVIS 2-3 VIEWS LEFT   No orders of the defined types were placed in this encounter.   Imaging: No results found.  PMFS History: Patient Active Problem List   Diagnosis Date Noted  . BMI less than 19,adult 03/25/2020  . Hip fracture ([place]) 03/21/2020   Past Medical History:  Diagnosis Date  . Coronary artery disease   . Hypertension     No family history on file.  Past Surgical History:  Procedure Laterality Date  . ABDOMINAL HYSTERECTOMY    . APPENDECTOMY    . CHOLECYSTECTOMY    . HIP ARTHROPLASTY Left 03/22/2020   Procedure: ARTHROPLASTY BIPOLAR HIP (HEMIARTHROPLASTY);  Surgeon: Meredith Pel, MD;  Location: [place];  Service: Orthopedics;  Laterality: Left;   Social History   Occupational History  . Not on file  Tobacco Use  . Smoking status: Former Research scientist (life sciences)  . Smokeless tobacco: Never Used  . Tobacco comment: quit at age 24  Vaping Use  . Vaping Use: Never used  Substance and Sexual Activity  . Alcohol use: Not Currently  . Drug use: Not Currently  . Sexual activity: Not Currently

## 2020-05-12 ENCOUNTER — Ambulatory Visit (INDEPENDENT_AMBULATORY_CARE_PROVIDER_SITE_OTHER): Payer: Medicare Other | Admitting: Orthopedic Surgery

## 2020-05-12 ENCOUNTER — Other Ambulatory Visit: Payer: Self-pay

## 2020-05-12 DIAGNOSIS — S72002D Fracture of unspecified part of neck of left femur, subsequent encounter for closed fracture with routine healing: Secondary | ICD-10-CM

## 2020-05-18 ENCOUNTER — Encounter: Payer: Self-pay | Admitting: Orthopedic Surgery

## 2020-05-18 NOTE — Progress Notes (Signed)
   Post-Op Visit Note   Patient: Melissa Noble           Date of Birth: 19-Oct-1923           MRN: [id number] Visit Date: 05/12/2020 PCP: Roetta Sessions, NP   Assessment & Plan:  Chief Complaint:  Chief Complaint  Patient presents with  . Post-op Follow-up   Visit Diagnoses:  1. Closed fracture of left hip with routine healing, subsequent encounter     Plan: Melissa is a 86 year old patient who underwent left hip hemiarthroplasty about 2 months ago.  Therapy 2-3 times a week.  She is full weightbearing with walker.  She actually had a fall on the day of her clinic visit with elbow laceration and skin avulsion.  Not affecting her hip.  On examination she is walking with a walker and has equal leg lengths.  No groin pain on either side with internal extra rotation of the leg.  No knee effusion bilaterally no ankle crepitus or swelling bilaterally.  Left elbow is examined.  Has excellent range of motion flexion and extension but does have a skin tag which is dressed nicely.  No crepitus with range of motion of that left elbow.  Plan at this time is to continue weightbearing as tolerated in the left hip.  Continue with strengthening with OT and physical therapy for about another 3 to 4 weeks.  Follow-up with [place] as needed.  Follow-Up Instructions: Return if symptoms worsen or fail to improve.   Orders:  No orders of the defined types were placed in this encounter.  No orders of the defined types were placed in this encounter.   Imaging: No results found.  PMFS History: Patient Active Problem List   Diagnosis Date Noted  . BMI less than 19,adult 03/25/2020  . Hip fracture ([place]) 03/21/2020   Past Medical History:  Diagnosis Date  . Coronary artery disease   . Hypertension     History reviewed. No pertinent family history.  Past Surgical History:  Procedure Laterality Date  . ABDOMINAL HYSTERECTOMY    . APPENDECTOMY    . CHOLECYSTECTOMY    . HIP ARTHROPLASTY  Left 03/22/2020   Procedure: ARTHROPLASTY BIPOLAR HIP (HEMIARTHROPLASTY);  Surgeon: Meredith Pel, MD;  Location: [place];  Service: Orthopedics;  Laterality: Left;   Social History   Occupational History  . Not on file  Tobacco Use  . Smoking status: Former Research scientist (life sciences)  . Smokeless tobacco: Never Used  . Tobacco comment: quit at age 38  Vaping Use  . Vaping Use: Never used  Substance and Sexual Activity  . Alcohol use: Not Currently  . Drug use: Not Currently  . Sexual activity: Not Currently

## 2020-09-12 DEATH — deceased

## 2021-09-07 IMAGING — CT CT HEAD CODE STROKE
3 series · 15 of 47 positions shown, 18 images · non-contrast
Comparison: 03/21/2020

CLINICAL DATA: Code stroke.  Aphasia.  Mental status changes.

EXAM:
CT HEAD WITHOUT CONTRAST
TECHNIQUE: Contiguous axial images were obtained from the base of the skull
through the vertex without intravenous contrast.

[Series 3: head 5.0 st · axial · 0.41mm/px · z∈[-79,+51]mm · 9 of 32 slices shown, 12 images]
[im 3/32  brain]
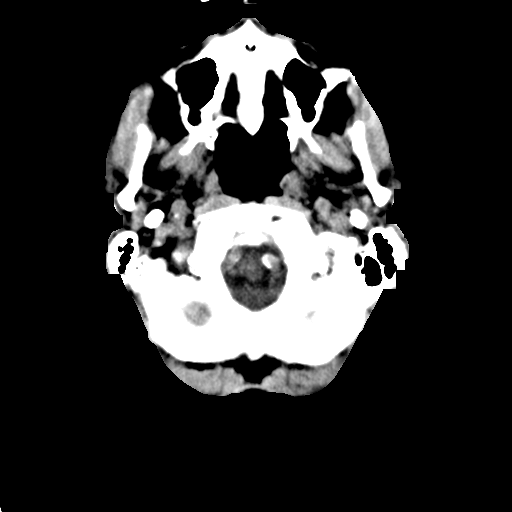
[im 3/32  bone]
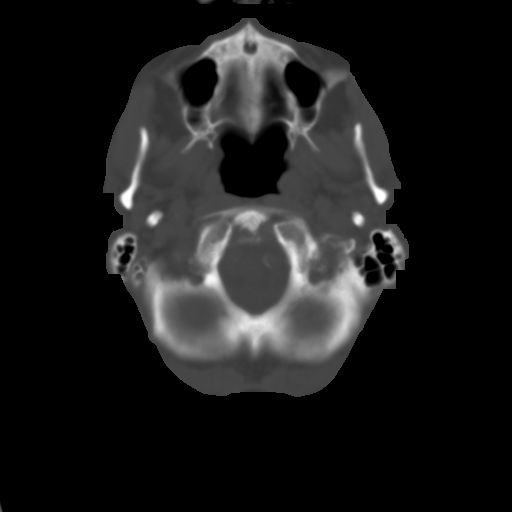
[im 6/32  brain]
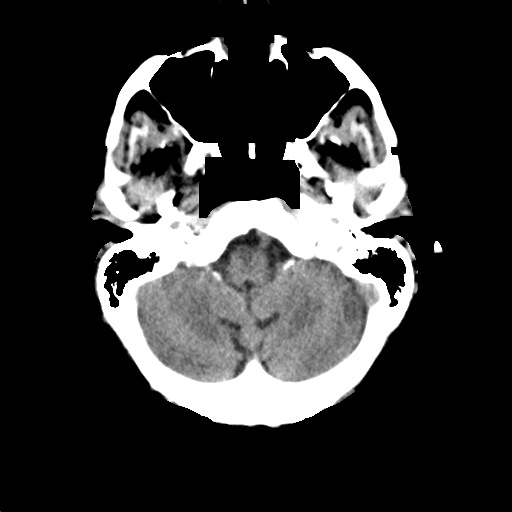
[im 9/32  brain]
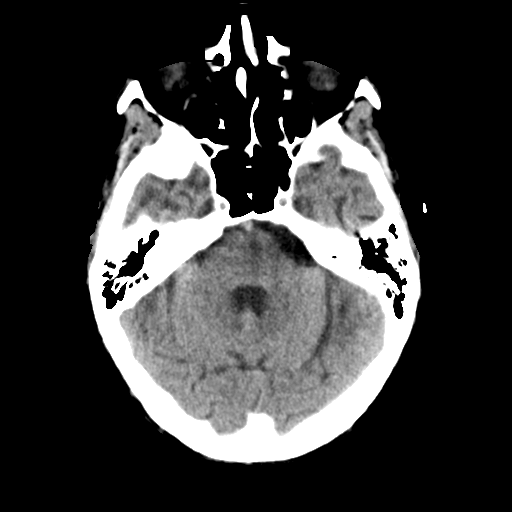
[im 12/32  brain]
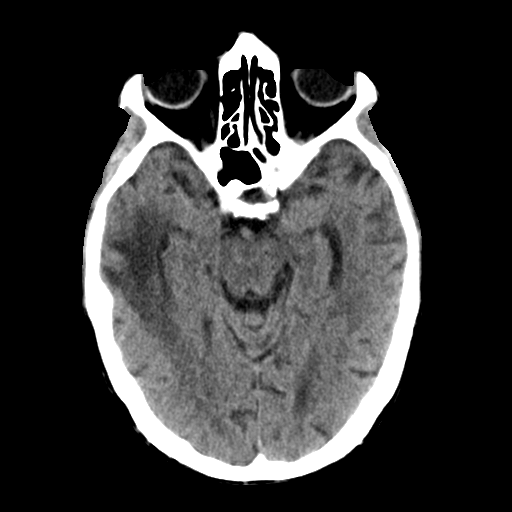
[im 17/32  brain]
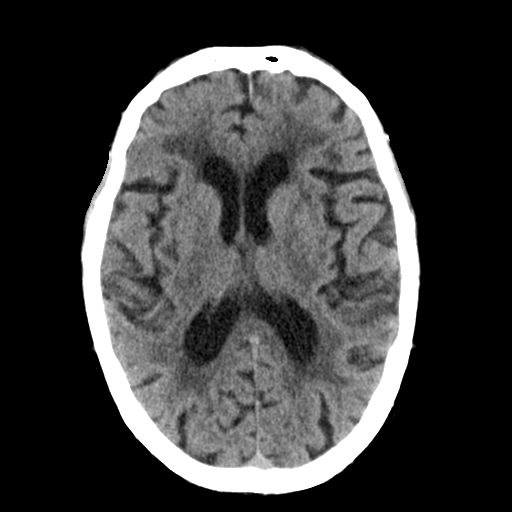
[im 17/32  bone]
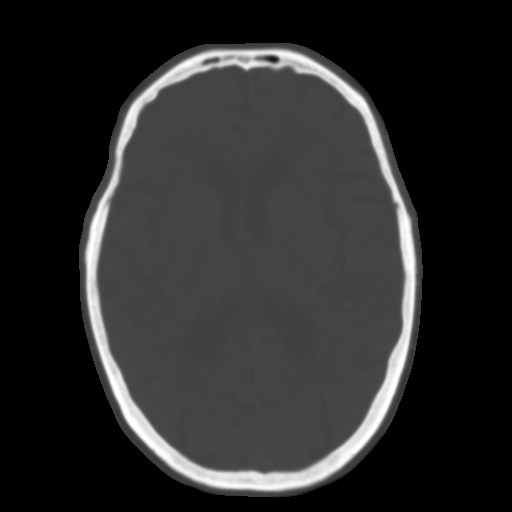
[im 20/32  brain]
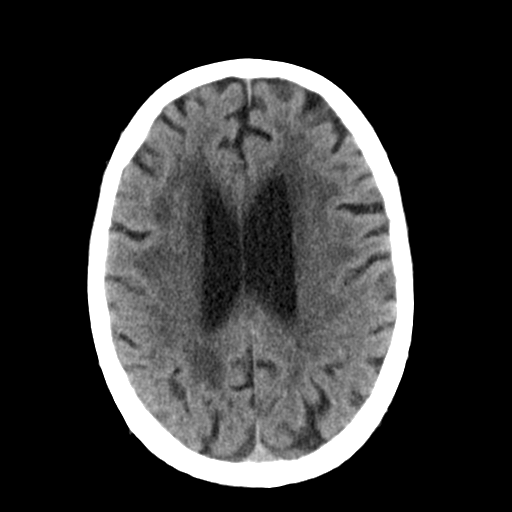
[im 23/32  brain]
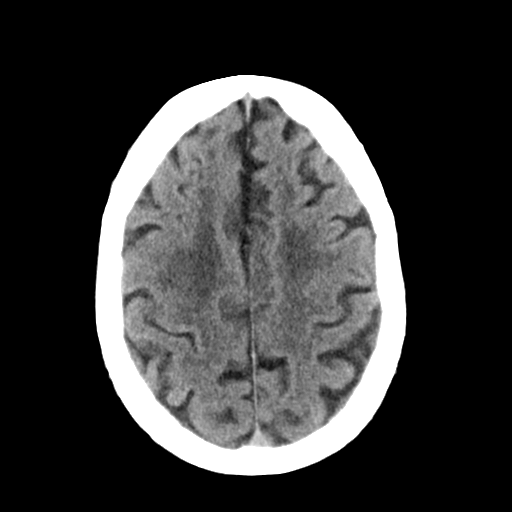
[im 26/32  brain]
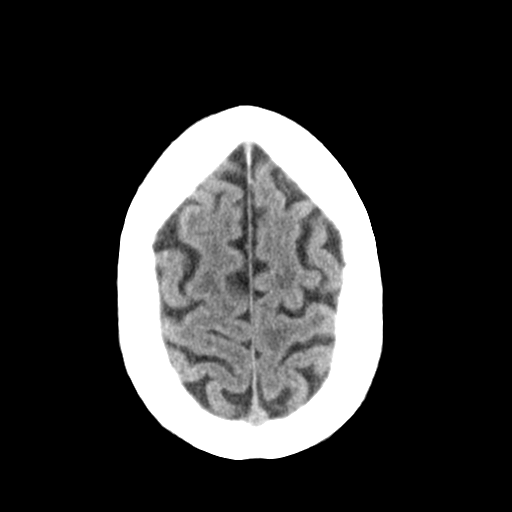
[im 29/32  brain]
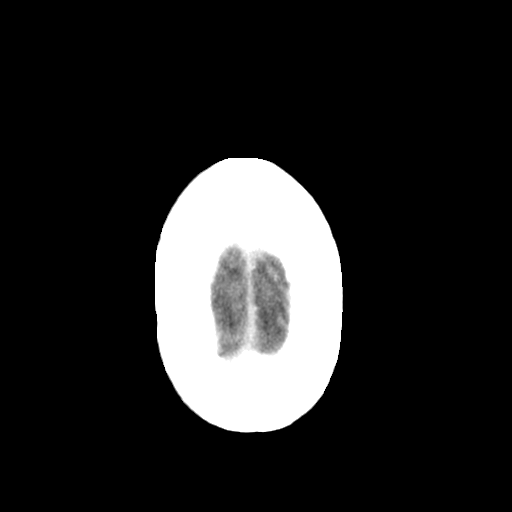
[im 29/32  bone]
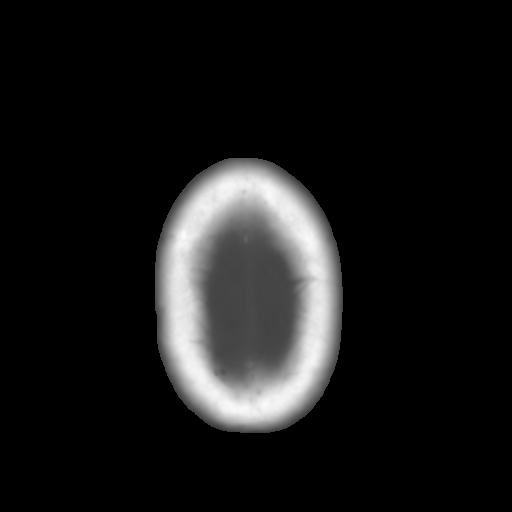

[Series 5: head 3.0 cor st · coronal · 0.35mm/px · 3 of 67 slices shown]
[im 23/67  brain]
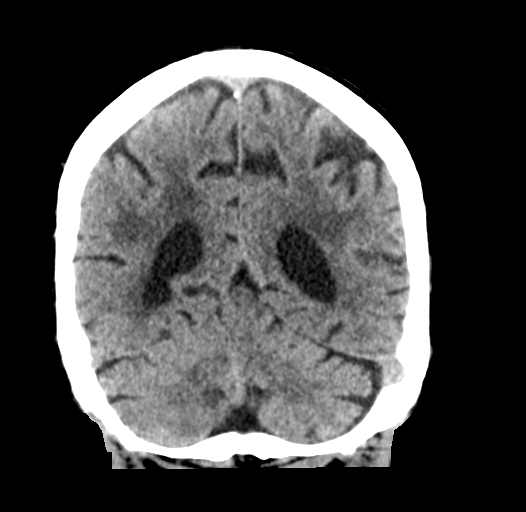
[im 30/67  brain]
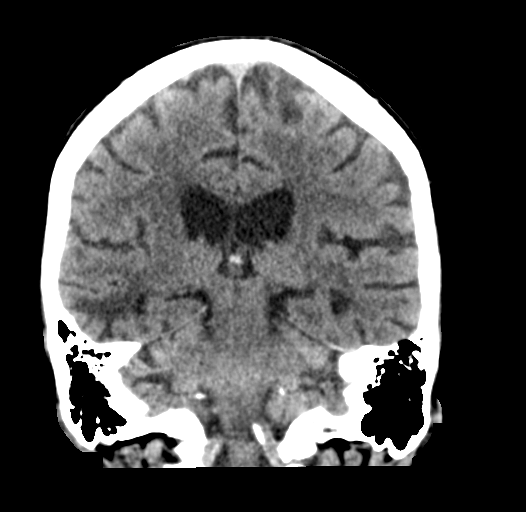
[im 37/67  brain]
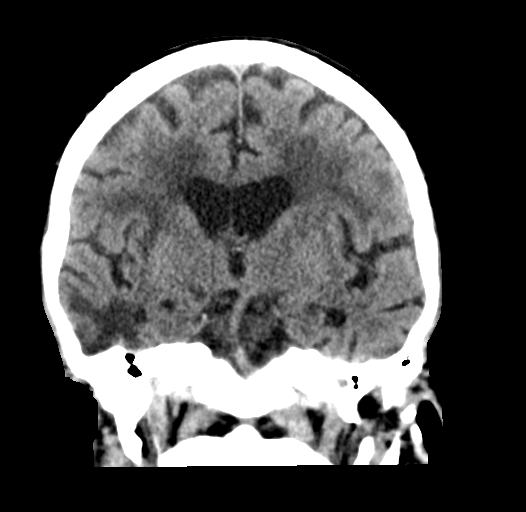

[Series 6: head 3.0 sag st · sagittal · 0.33mm/px · 3 of 50 slices shown]
[im 17/50  brain]
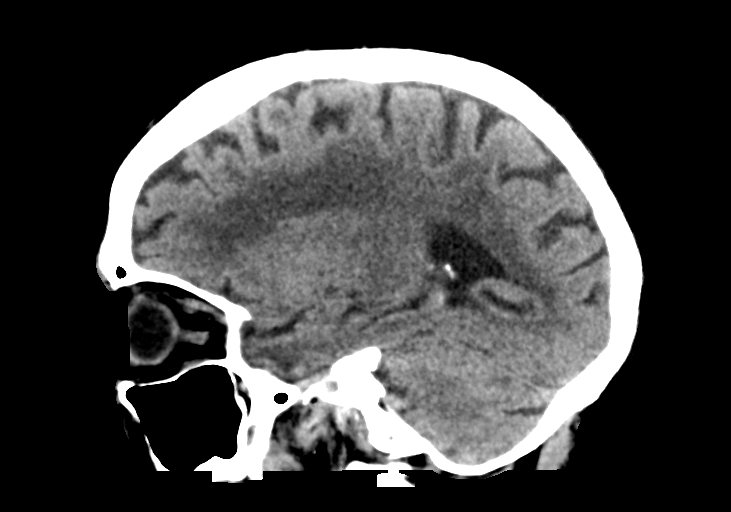
[im 25/50  brain]
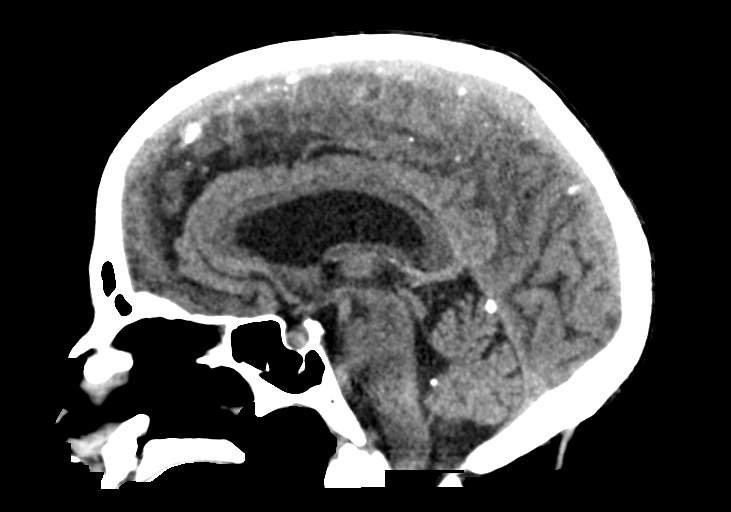
[im 33/50  brain]
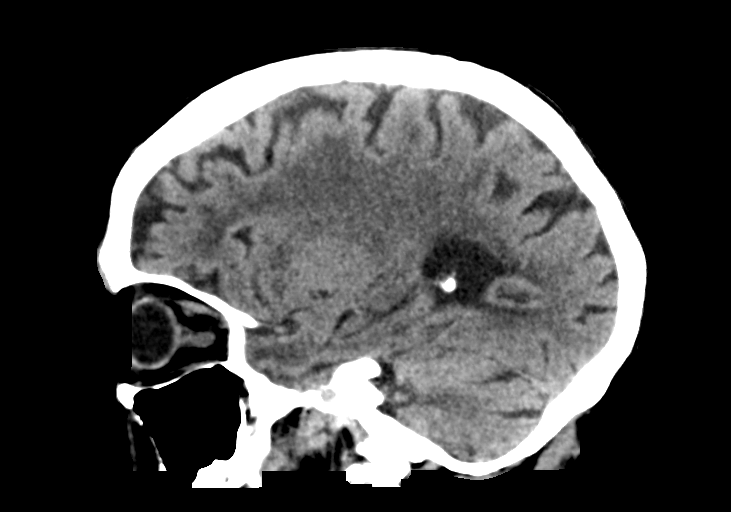

[15 of 47 positions shown; findings below may reference images not displayed]

FINDINGS: Brain: Generalized atrophy. Extensive chronic small-vessel ischemic
changes throughout the cerebral hemispheric white matter. Old right
temporal cortical and subcortical infarction. No CT evidence of
acute infarction, mass lesion, hemorrhage, hydrocephalus or
extra-axial collection.

Vascular: No acute vascular finding.

Skull: Negative

Sinuses/Orbits: No significant sinus inflammatory disease. Orbits
negative.

Other: None

ASPECTS (Alberta Stroke Program Early CT Score)

- Ganglionic level infarction (caudate, lentiform nuclei, internal
capsule, insula, M1-M3 cortex): 7

- Supraganglionic infarction (M4-M6 cortex): 3

Total score (0-10 with 10 being normal): 10
IMPRESSION: 1. No acute finding by CT. Atrophy and extensive chronic
small-vessel ischemic changes. Old right temporal cortical and
subcortical infarction.
2. ASPECTS is 10.
3. These results were communicated to Dr. Dejoun at [DATE] [place]
03/22/2020by text page via the [employer] messaging system.

## 2021-09-07 IMAGING — RF DG HIP (WITH PELVIS) OPERATIVE*L*
1 series · 2 of 2 positions shown · non-contrast
Comparison: Left hip radiographs-03/21/2020

CLINICAL DATA: Left hip fracture

EXAM:
OPERATIVE LEFT HIP (WITH PELVIS IF PERFORMED) 2 VIEWS
TECHNIQUE: Fluoroscopic spot image(s) were submitted for interpretation
post-operatively.
FLUOROSCOPY TIME:  17 seconds

[Series 1: run · 2 of 2 slices shown]
[im 1/2]
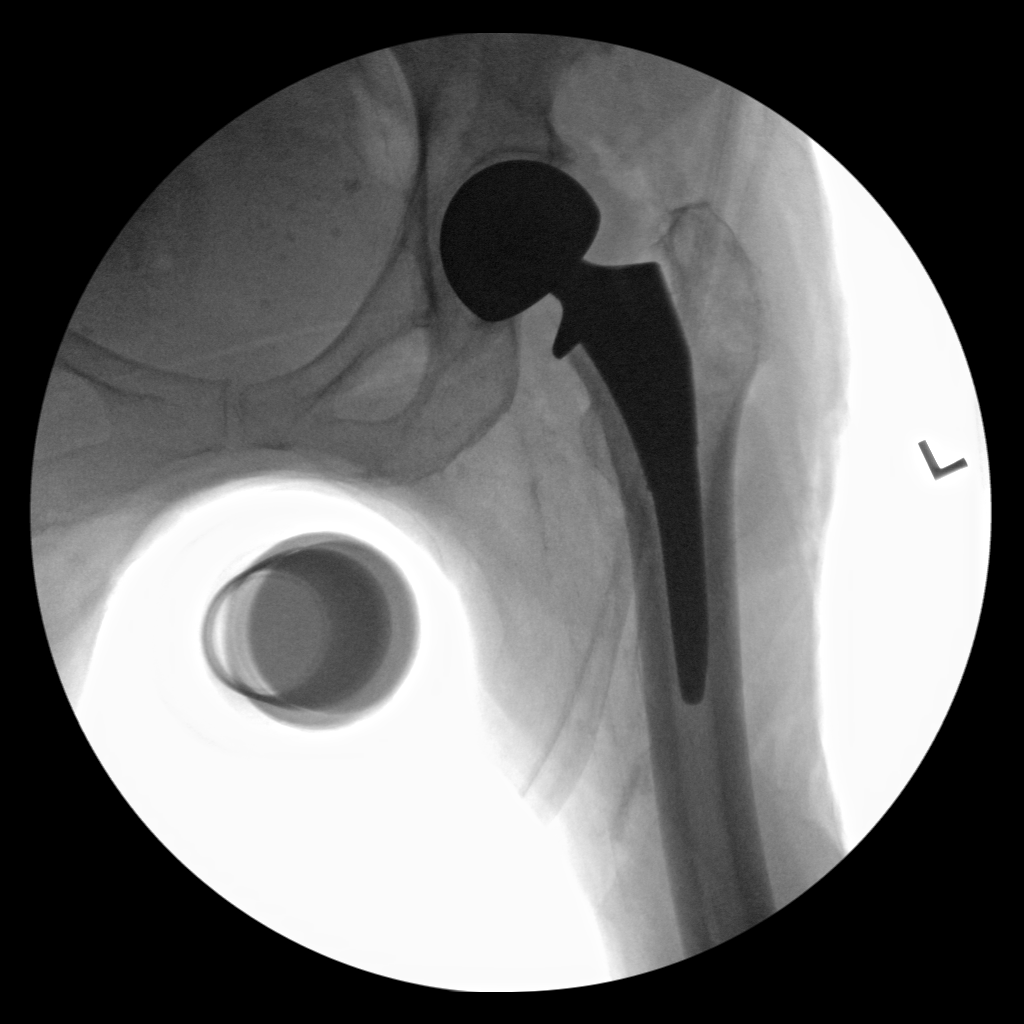
[im 2/2]
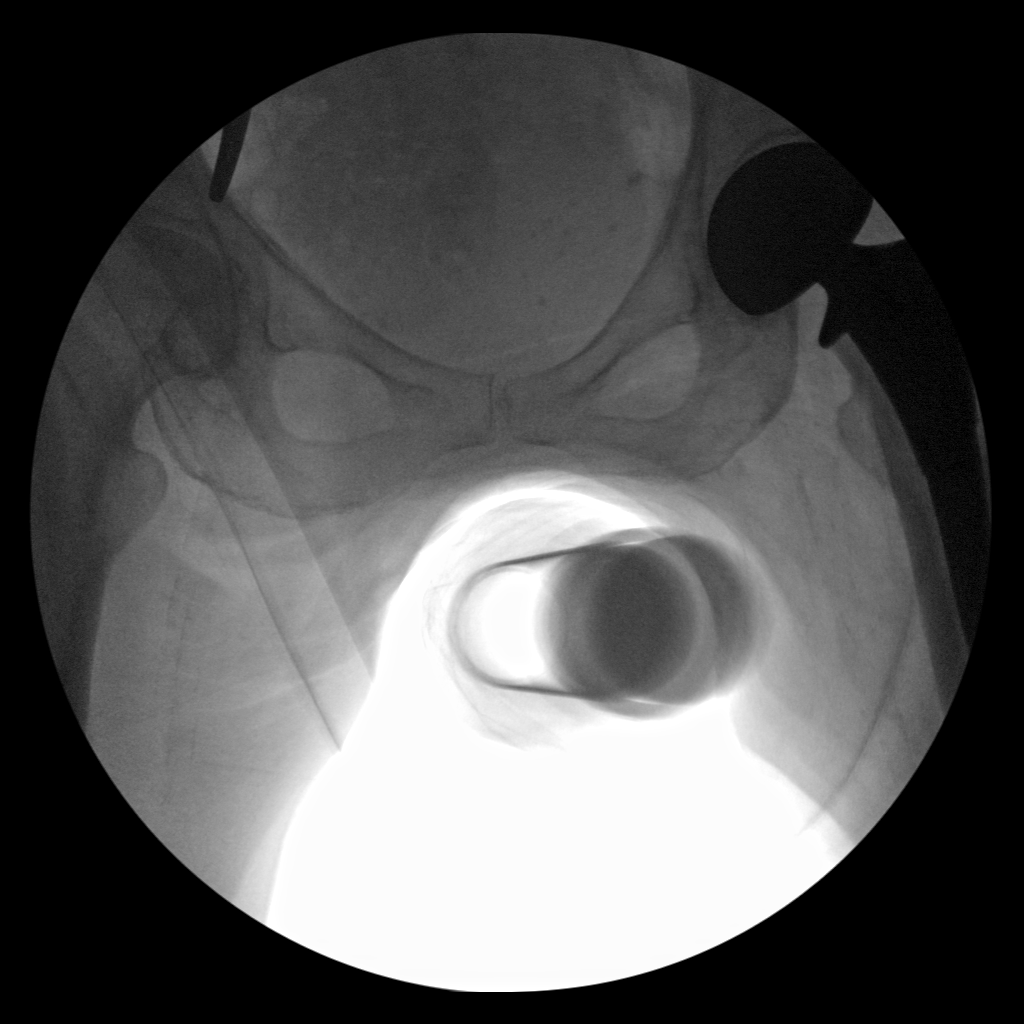

[2 of 2 positions shown; findings below may reference images not displayed]

FINDINGS: 2 spot intraoperative fluoroscopic images of the left hip and lower
pelvis are provided for review.

Images demonstrate the sequela of left bipolar hip replacement.
Alignment appears anatomic given AP projection. There is a minimal
amount of subcutaneous emphysema scattered about the operative site.
A radiopaque clamp overlies the contralateral right hemipelvis,
likely external to the patient. No definite radiopaque foreign body

Limited visualization of the pelvis demonstrates several phleboliths
overlying the lower pelvis bilaterally. Scattered adjacent vascular
calcifications.
IMPRESSION: Post left bipolar hip replacement without evidence of complication.

## 2021-09-07 IMAGING — RF DG C-ARM 1-60 MIN
1 series · 2 of 2 positions shown · non-contrast
Comparison: Left hip radiographs-03/21/2020

CLINICAL DATA: Left hip fracture

EXAM:
OPERATIVE LEFT HIP (WITH PELVIS IF PERFORMED) 2 VIEWS
TECHNIQUE: Fluoroscopic spot image(s) were submitted for interpretation
post-operatively.
FLUOROSCOPY TIME:  17 seconds

[Series 1: run · 2 of 2 slices shown]
[im 1/2]
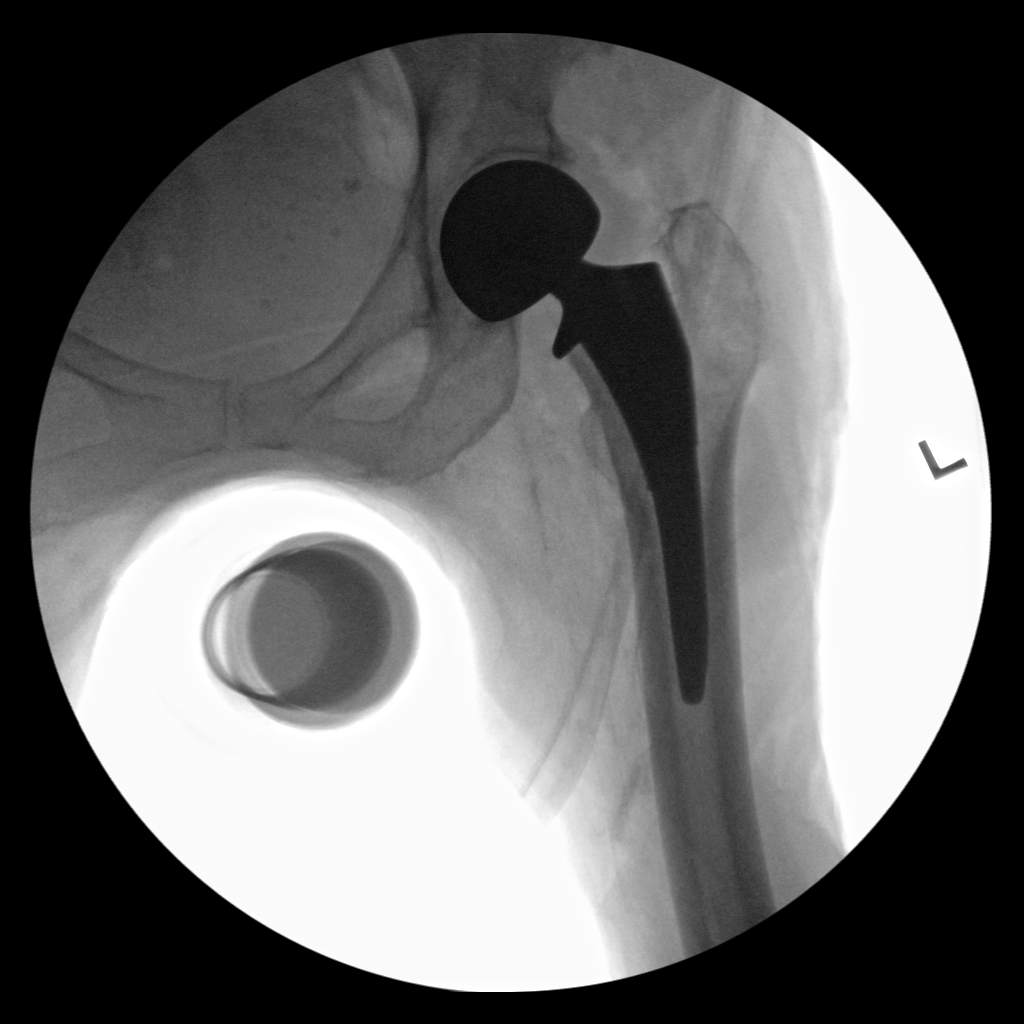
[im 2/2]
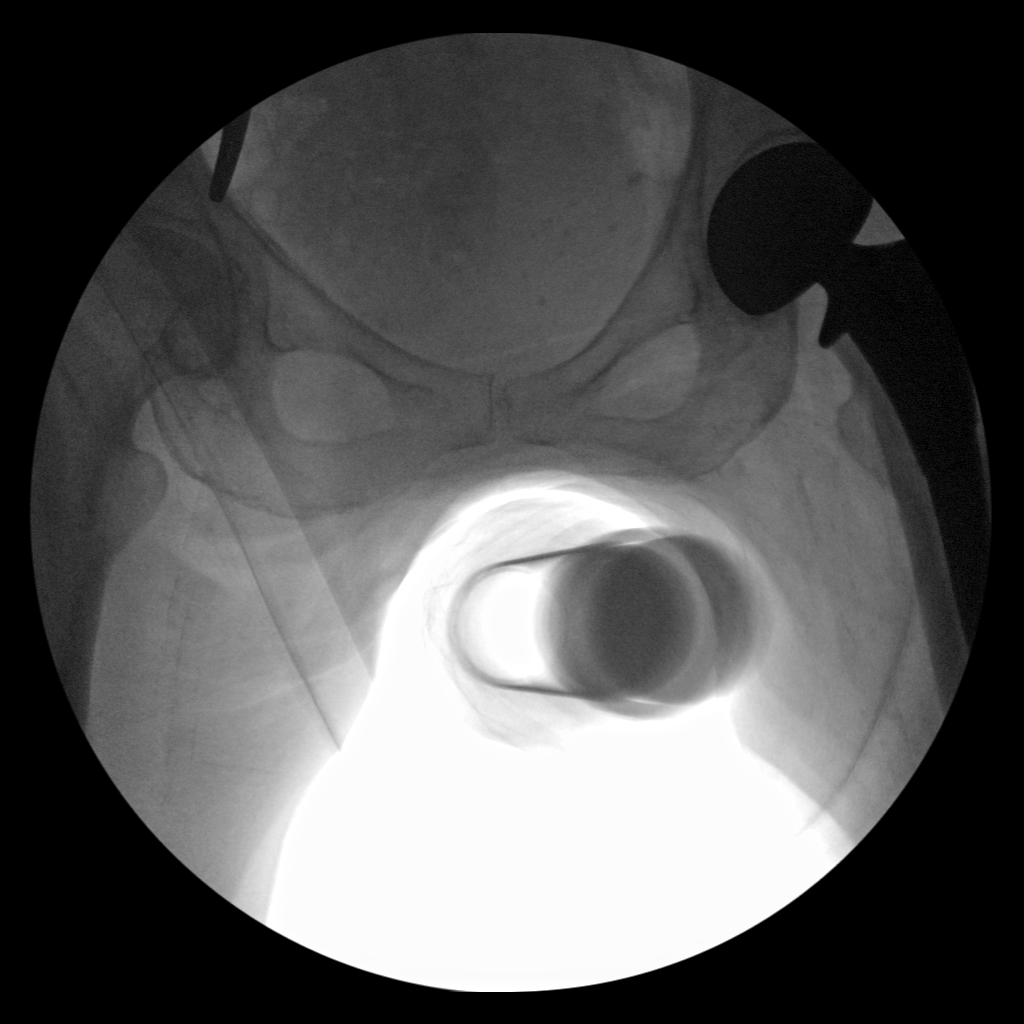

[2 of 2 positions shown; findings below may reference images not displayed]

FINDINGS: 2 spot intraoperative fluoroscopic images of the left hip and lower
pelvis are provided for review.

Images demonstrate the sequela of left bipolar hip replacement.
Alignment appears anatomic given AP projection. There is a minimal
amount of subcutaneous emphysema scattered about the operative site.
A radiopaque clamp overlies the contralateral right hemipelvis,
likely external to the patient. No definite radiopaque foreign body

Limited visualization of the pelvis demonstrates several phleboliths
overlying the lower pelvis bilaterally. Scattered adjacent vascular
calcifications.
IMPRESSION: Post left bipolar hip replacement without evidence of complication.
